# Patient Record
Sex: Male | Born: 1995 | Race: White | Hispanic: No | Marital: Married | Smoking: Never smoker
Health system: Southern US, Community
[De-identification: ages and names within clinical notes are randomized; demographics above are authoritative.]

## PROBLEM LIST (undated history)

## (undated) DIAGNOSIS — R519 Headache, unspecified: Secondary | ICD-10-CM

## (undated) DIAGNOSIS — G43909 Migraine, unspecified, not intractable, without status migrainosus: Secondary | ICD-10-CM

## (undated) DIAGNOSIS — F32A Depression, unspecified: Secondary | ICD-10-CM

## (undated) HISTORY — DX: Depression, unspecified: F32.A

## (undated) HISTORY — DX: Headache, unspecified: R51.9

---
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
## 2016-06-14 ENCOUNTER — Emergency Department (HOSPITAL_BASED_OUTPATIENT_CLINIC_OR_DEPARTMENT_OTHER)
Admission: EM | Admit: 2016-06-14 | Discharge: 2016-06-14 | Disposition: A | Discharge status: Home / Self Care | Payer: Managed Care, Other (non HMO) | Attending: Emergency Medicine | Admitting: Emergency Medicine

## 2016-06-14 ENCOUNTER — Emergency Department (HOSPITAL_BASED_OUTPATIENT_CLINIC_OR_DEPARTMENT_OTHER): Payer: Managed Care, Other (non HMO)

## 2016-06-14 ENCOUNTER — Encounter (HOSPITAL_BASED_OUTPATIENT_CLINIC_OR_DEPARTMENT_OTHER): Payer: Self-pay | Admitting: *Deleted

## 2016-06-14 DIAGNOSIS — N200 Calculus of kidney: Secondary | ICD-10-CM

## 2016-06-14 DIAGNOSIS — R229 Localized swelling, mass and lump, unspecified: Secondary | ICD-10-CM

## 2016-06-14 DIAGNOSIS — IMO0002 Reserved for concepts with insufficient information to code with codable children: Secondary | ICD-10-CM

## 2016-06-14 DIAGNOSIS — Z79899 Other long term (current) drug therapy: Secondary | ICD-10-CM | POA: Insufficient documentation

## 2016-06-14 DIAGNOSIS — R109 Unspecified abdominal pain: Secondary | ICD-10-CM | POA: Diagnosis present

## 2016-06-14 HISTORY — DX: Migraine, unspecified, not intractable, without status migrainosus: G43.909

## 2016-06-14 LAB — CBC WITH DIFFERENTIAL/PLATELET
BASOS PCT: 0 %
Basophils Absolute: 0 10*3/uL (ref 0.0–0.1)
EOS PCT: 0 %
Eosinophils Absolute: 0 10*3/uL (ref 0.0–0.7)
HCT: 47.1 % (ref 39.0–52.0)
HEMOGLOBIN: 16.3 g/dL (ref 13.0–17.0)
LYMPHS ABS: 1.3 10*3/uL (ref 0.7–4.0)
Lymphocytes Relative: 10 %
MCH: 28.5 pg (ref 26.0–34.0)
MCHC: 34.6 g/dL (ref 30.0–36.0)
MCV: 82.5 fL (ref 78.0–100.0)
MONO ABS: 0.9 10*3/uL (ref 0.1–1.0)
Monocytes Relative: 7 %
Neutro Abs: 10.6 10*3/uL — ABNORMAL HIGH (ref 1.7–7.7)
Neutrophils Relative %: 83 %
PLATELETS: 301 10*3/uL (ref 150–400)
RBC: 5.71 MIL/uL (ref 4.22–5.81)
RDW: 13.6 % (ref 11.5–15.5)
WBC: 12.8 10*3/uL — ABNORMAL HIGH (ref 4.0–10.5)

## 2016-06-14 LAB — COMPREHENSIVE METABOLIC PANEL
ALK PHOS: 72 U/L (ref 38–126)
ALT: 32 U/L (ref 17–63)
ANION GAP: 9 (ref 5–15)
AST: 24 U/L (ref 15–41)
Albumin: 4.4 g/dL (ref 3.5–5.0)
BUN: 20 mg/dL (ref 6–20)
CALCIUM: 9.4 mg/dL (ref 8.9–10.3)
CO2: 28 mmol/L (ref 22–32)
Chloride: 101 mmol/L (ref 101–111)
Creatinine, Ser: 0.93 mg/dL (ref 0.61–1.24)
GFR calc non Af Amer: >60 mL/min (ref >60)
Glucose, Bld: 127 mg/dL — ABNORMAL HIGH (ref 65–99)
Potassium: 3.9 mmol/L (ref 3.5–5.1)
SODIUM: 138 mmol/L (ref 135–145)
TOTAL PROTEIN: 7.5 g/dL (ref 6.5–8.1)
Total Bilirubin: 0.5 mg/dL (ref 0.3–1.2)

## 2016-06-14 LAB — URINALYSIS, MICROSCOPIC (REFLEX)

## 2016-06-14 LAB — URINALYSIS, ROUTINE W REFLEX MICROSCOPIC
Bilirubin Urine: NEGATIVE
Glucose, UA: NEGATIVE mg/dL
Ketones, ur: NEGATIVE mg/dL
LEUKOCYTES UA: NEGATIVE
Nitrite: NEGATIVE
PROTEIN: 30 mg/dL — AB
Specific Gravity, Urine: 1.026 (ref 1.005–1.030)
pH: 8.5 — ABNORMAL HIGH (ref 5.0–8.0)

## 2016-06-14 LAB — LIPASE, BLOOD: LIPASE: 25 U/L (ref 11–51)

## 2016-06-14 MED ORDER — SODIUM CHLORIDE 0.9 % IV BOLUS (SEPSIS)
1000.0000 mL | Freq: Once | INTRAVENOUS | Status: AC
Start: 2016-06-14 — End: 2016-06-14
  Administered 2016-06-14: 1000 mL via INTRAVENOUS

## 2016-06-14 MED ORDER — HYDROMORPHONE HCL 1 MG/ML IJ SOLN
1.0000 mg | Freq: Once | INTRAMUSCULAR | Status: AC
  Administered 2016-06-14: 1 mg via INTRAVENOUS
  Filled 2016-06-14: qty 1

## 2016-06-14 MED ORDER — FENTANYL CITRATE (PF) 100 MCG/2ML IJ SOLN
50.0000 ug | INTRAMUSCULAR | Status: DC | PRN
  Administered 2016-06-14: 50 ug via INTRAVENOUS
  Filled 2016-06-14: qty 2

## 2016-06-14 MED ORDER — KETOROLAC TROMETHAMINE 30 MG/ML IJ SOLN
30.0000 mg | Freq: Once | INTRAMUSCULAR | Status: AC
  Administered 2016-06-14: 30 mg via INTRAVENOUS
  Filled 2016-06-14: qty 1

## 2016-06-14 MED ORDER — SODIUM CHLORIDE 0.9 % IV BOLUS (SEPSIS)
1000.0000 mL | Freq: Once | INTRAVENOUS | Status: AC
  Administered 2016-06-14: 1000 mL via INTRAVENOUS

## 2016-06-14 MED ORDER — TAMSULOSIN HCL 0.4 MG PO CAPS: 0.4000 mg | ORAL_CAPSULE | Freq: Every day | ORAL | 0 refills | Status: DC

## 2016-06-14 MED ORDER — ONDANSETRON HCL 4 MG/2ML IJ SOLN
4.0000 mg | Freq: Once | INTRAMUSCULAR | Status: AC
  Administered 2016-06-14: 4 mg via INTRAVENOUS
  Filled 2016-06-14: qty 2

## 2016-06-14 MED ORDER — MORPHINE SULFATE (PF) 4 MG/ML IV SOLN
4.0000 mg | Freq: Once | INTRAVENOUS | Status: AC
  Administered 2016-06-14: 4 mg via INTRAVENOUS
  Filled 2016-06-14: qty 1

## 2016-06-14 MED ORDER — MORPHINE SULFATE 15 MG PO TABS: 15.0000 mg | ORAL_TABLET | ORAL | 0 refills | Status: DC | PRN

## 2016-06-14 MED ORDER — ONDANSETRON 4 MG PO TBDP: ORAL_TABLET | ORAL | 0 refills | Status: DC

## 2016-06-14 MED ORDER — TAMSULOSIN HCL 0.4 MG PO CAPS
0.4000 mg | ORAL_CAPSULE | ORAL | Status: AC
  Administered 2016-06-14: 0.4 mg via ORAL
  Filled 2016-06-14: qty 1

## 2016-06-14 MED ORDER — MORPHINE SULFATE (PF) 4 MG/ML IV SOLN
4.0000 mg | Freq: Once | INTRAVENOUS | Status: DC
Start: 2016-06-14 — End: 2016-06-14

## 2016-06-14 NOTE — ED Notes (Signed)
Pt transported to CT and US. 

## 2016-06-14 NOTE — Discharge Instructions (Signed)
Take 2 over-the-counter naproxen tablets twice a day for pain. Also take tylenol 1000mg(2 extra strength) four times a day.   Then take the pain medicine if you feel like you need it. Narcotics do not help with the pain, they only make you care about it less.  You can become addicted to this, people may break into your house to steal it.  It will constipate you.  If you drive under the influence of this medicine you can get a DUI.    

## 2016-06-14 NOTE — ED Notes (Signed)
ED Provider at bedside. 

## 2016-06-14 NOTE — ED Triage Notes (Signed)
Pt c/o sudden right flank pain which radiates to right lower abd and groin x 40 mins

## 2016-06-14 NOTE — ED Notes (Signed)
Pt verbalizes understanding of d/c instructions and denies any further needs at this time. 

## 2016-06-14 NOTE — ED Provider Notes (Signed)
[facility] Provider Note   CSN: [id number] Arrival date & time: 06/14/16  2032  By signing my name below, I, Linna Darner, attest that this documentation has been prepared under the direction and in the presence of physician practitioner, Melene Plan, DO. Electronically Signed: Linna Darner, Scribe. 06/14/2016. 9:31 PM.  History   Chief Complaint Chief Complaint  Patient presents with  . Flank Pain    The history is provided by the patient. No language interpreter was used.  Flank Pain  This is a new problem. The current episode started 1 to 2 hours ago. The problem has not changed since onset.Pertinent negatives include no chest pain, no abdominal pain, no headaches and no shortness of breath. Nothing aggravates the symptoms. Nothing relieves the symptoms.   HPI Comments: Mario Shaffer is a 21 y.o. male who presents to the Emergency Department complaining of sudden onset, constant, waxing and waning, severe, right flank pain beginning about 2 hours ago. He reports pain radiation into his pelvis, right groin, and right testicle. He states his most severe pain currently is in his right testicle. No modifying factors noted. No recent trauma or injury to his testicles. Pt has had several episodes of nausea/vomiting since onset and states these symptoms are improved since receiving Zofran in triage tonight. No h/o similar symptoms in the past. No h/o kidney stones. He denies hematuria, fever, chills, or any other associated symptoms.  Past Medical History:  Diagnosis Date  . Migraine     There are no active problems to display for this patient.   History reviewed. No pertinent surgical history.     Home Medications    Prior to Admission medications   Medication Sig Start Date End Date Taking? Authorizing Provider  traMADol (ULTRAM) 50 MG tablet Take by mouth every 6 (six) hours as needed.   Yes Historical Provider, MD  morphine (MSIR) 15 MG tablet Take 1 tablet (15  mg total) by mouth every 4 (four) hours as needed for severe pain. 06/14/16   Melene Plan, DO  ondansetron (ZOFRAN ODT) 4 MG disintegrating tablet 4mg  ODT q4 hours prn nausea/vomit 06/14/16   Melene Plan, DO  tamsulosin (FLOMAX) 0.4 MG CAPS capsule Take 1 capsule (0.4 mg total) by mouth daily after supper. 06/14/16   Melene Plan, DO    Family History History reviewed. No pertinent family history.  Social History Social History  Substance Use Topics  . Smoking status: Never Smoker  . Smokeless tobacco: Not on file  . Alcohol use No     Allergies   Patient has no known allergies.   Review of Systems Review of Systems  Constitutional: Negative for chills and fever.  Respiratory: Negative for shortness of breath.   Cardiovascular: Negative for chest pain.  Gastrointestinal: Negative for abdominal pain.  Genitourinary: Positive for flank pain (right) and testicular pain (right). Negative for hematuria.  Neurological: Negative for headaches.  All other systems reviewed and are negative.    Physical Exam Updated Vital Signs BP 126/92 (BP Location: Left Arm)   Pulse (!) 53   Temp 97.7 F (36.5 C)   Resp 18   Ht 6\' 3"  (1.905 m)   Wt 230 lb (104.3 kg)   SpO2 99%   BMI 28.75 kg/m   Physical Exam  Constitutional: He is oriented to person, place, and time. He appears well-developed and well-nourished.  Appears uncomfortable.  HENT:  Head: Normocephalic and atraumatic.  Eyes: EOM are normal. Pupils are equal, round, and reactive  to light.  Neck: Normal range of motion. Neck supple. No JVD present.  Cardiovascular: Normal rate and regular rhythm.  Exam reveals no gallop and no friction rub.   No murmur heard. Pulmonary/Chest: No respiratory distress. He has no wheezes.  Abdominal: He exhibits no distension. There is tenderness. There is no rebound and no guarding.  Pain in RLQ. No rebound.  Genitourinary:  Genitourinary Comments: Tenderness about posterior pole of right testicle. No  masses, no erythema.    Musculoskeletal: Normal range of motion.  Neurological: He is alert and oriented to person, place, and time.  Skin: No rash noted. There is pallor.  Pale.  Psychiatric: He has a normal mood and affect. His behavior is normal.  Nursing note and vitals reviewed.    ED Treatments / Results  Labs (all labs ordered are listed, but only abnormal results are displayed) Labs Reviewed  URINALYSIS, ROUTINE W REFLEX MICROSCOPIC - Abnormal; Notable for the following:       Result Value   APPearance TURBID (*)    pH 8.5 (*)    Hgb urine dipstick LARGE (*)    Protein, ur 30 (*)    All other components within normal limits  URINALYSIS, MICROSCOPIC (REFLEX) - Abnormal; Notable for the following:    Bacteria, UA RARE (*)    Squamous Epithelial / LPF 0-5 (*)    All other components within normal limits  CBC WITH DIFFERENTIAL/PLATELET - Abnormal; Notable for the following:    WBC 12.8 (*)    Neutro Abs 10.6 (*)    All other components within normal limits  COMPREHENSIVE METABOLIC PANEL - Abnormal; Notable for the following:    Glucose, Bld 127 (*)    All other components within normal limits  LIPASE, BLOOD    EKG  EKG Interpretation None       Radiology [place] Scrotum  Result Date: 06/14/2016 CLINICAL DATA:  Sudden onset of right flank pain radiating to the testicle EXAM: SCROTAL ULTRASOUND DOPPLER ULTRASOUND OF THE TESTICLES TECHNIQUE: Complete ultrasound examination of the testicles, epididymis, and other scrotal structures was performed. Color and spectral Doppler ultrasound were also utilized to evaluate blood flow to the testicles. COMPARISON:  CT 06/14/2016 FINDINGS: Right testicle Measurements: 4.6 x 2 x 3.1 cm. No mass or microlithiasis visualized. Left testicle Measurements: 5 x 2.2 x 3.4 cm. No mass or microlithiasis visualized. Right epididymis:  Small cyst measuring 2 mm. Left epididymis:  Small cyst measuring 2 mm. Hydrocele:  None visualized. Varicocele:   None visualized. Pulsed Doppler interrogation of both testes demonstrates normal low resistance arterial and venous waveforms bilaterally. IMPRESSION: 1. No sonographic evidence for testicular torsion or mass 2. Tiny epididymal cysts Electronically Signed   By: Jasmine PangKim  Fujinaga M.D.   On: 06/14/2016 22:37   [place] Art/ven Flow Abd Pelv Doppler  Result Date: 06/14/2016 CLINICAL DATA:  Sudden onset of right flank pain radiating to the testicle EXAM: SCROTAL ULTRASOUND DOPPLER ULTRASOUND OF THE TESTICLES TECHNIQUE: Complete ultrasound examination of the testicles, epididymis, and other scrotal structures was performed. Color and spectral Doppler ultrasound were also utilized to evaluate blood flow to the testicles. COMPARISON:  CT 06/14/2016 FINDINGS: Right testicle Measurements: 4.6 x 2 x 3.1 cm. No mass or microlithiasis visualized. Left testicle Measurements: 5 x 2.2 x 3.4 cm. No mass or microlithiasis visualized. Right epididymis:  Small cyst measuring 2 mm. Left epididymis:  Small cyst measuring 2 mm. Hydrocele:  None visualized. Varicocele:  None visualized. Pulsed Doppler interrogation of both testes  demonstrates normal low resistance arterial and venous waveforms bilaterally. IMPRESSION: 1. No sonographic evidence for testicular torsion or mass 2. Tiny epididymal cysts Electronically Signed   By: Jasmine Pang M.D.   On: 06/14/2016 22:37   Ct Renal Stone Study  Result Date: 06/14/2016 CLINICAL DATA:  Sudden onset right flank pain for 2 hours. Pain radiates down the right pelvis, right groin, and right testicle. EXAM: CT ABDOMEN AND PELVIS WITHOUT CONTRAST TECHNIQUE: Multidetector CT imaging of the abdomen and pelvis was performed following the standard protocol without IV contrast. COMPARISON:  None. FINDINGS: Lower chest: Mild dependent changes in the lung bases. Hepatobiliary: No focal liver abnormality is seen. No gallstones, gallbladder wall thickening, or biliary dilatation. Pancreas: Unremarkable. No  pancreatic ductal dilatation or surrounding inflammatory changes. Spleen: Normal in size without focal abnormality. Adrenals/Urinary Tract: No adrenal gland nodules. Mild right hydronephrosis and hydroureter. Tiny stone demonstrated in the distal right ureter at the ureterovesical junction. Stone appears poorly mineralized and measures about 3 mm in diameter. There is stranding around the right kidney and ureter. No additional stones identified. Left kidney and collecting system are decompressed. Bladder is decompressed. Stomach/Bowel: Stomach is within normal limits. Appendix appears normal. No evidence of bowel wall thickening, distention, or inflammatory changes. Vascular/Lymphatic: No significant vascular findings are present. No enlarged abdominal or pelvic lymph nodes. Reproductive: Prostate gland is not enlarged. Other: Small amount of free fluid in the pelvis is likely reactive. No free air in the abdomen. Abdominal wall musculature appears intact. Musculoskeletal: Schmorl's node at L4.  No destructive bone lesions. IMPRESSION: 3 mm stone in the distal right ureter with moderate proximal obstruction. Electronically Signed   By: Burman Nieves M.D.   On: 06/14/2016 22:03    Procedures Procedures (including critical care time)  DIAGNOSTIC STUDIES: Oxygen Saturation is 100% on RA, normal by my interpretation.    COORDINATION OF CARE: 9:38 PM Discussed treatment plan with pt at bedside and pt agreed to plan.  Medications Ordered in ED Medications  fentaNYL (SUBLIMAZE) injection 50 mcg (50 mcg Intravenous Given 06/14/16 2110)  ondansetron (ZOFRAN) injection 4 mg (4 mg Intravenous Given 06/14/16 2109)  morphine 4 MG/ML injection 4 mg (4 mg Intravenous Given 06/14/16 2140)  ondansetron (ZOFRAN) injection 4 mg (4 mg Intravenous Given 06/14/16 2302)  sodium chloride 0.9 % bolus 1,000 mL (0 mLs Intravenous Stopped 06/14/16 2143)  ketorolac (TORADOL) 30 MG/ML injection 30 mg (30 mg Intravenous Given 06/14/16  2224)  sodium chloride 0.9 % bolus 1,000 mL (1,000 mLs Intravenous New Bag/Given 06/14/16 2302)  HYDROmorphone (DILAUDID) injection 1 mg (1 mg Intravenous Given 06/14/16 2304)     Initial Impression / Assessment and Plan / ED Course  I have reviewed the triage vital signs and the nursing notes.  Pertinent labs & imaging results that were available during my care of the patient were reviewed by me and considered in my medical decision making (see chart for details).  Clinical Course     21 yo M With a chief complaint of right flank pain. Started suddenly about an hour and a half ago. He feels that the pain is migrated down to his right lower quadrant analysis testicle. On my exam patient was some mild testicular pain worst in the right lower quadrant. CT scan study with a 3 mm partially obstructing stone. Patient is afebrile UAs without infection. Ultrasound of the testicle without torsion. Will control the patient's pain. Give IV fluids.  Patient feeling better on reassessment. Discharge home with  urology follow-up.  11:07 PM:  I have discussed the diagnosis/risks/treatment options with the patient and family and believe the pt to be eligible for discharge home to follow-up with PCP. We also discussed returning to the ED immediately if new or worsening sx occur. We discussed the sx which are most concerning (e.g., sudden worsening pain, fever, inability to tolerate by mouth) that necessitate immediate return. Medications administered to the patient during their visit and any new prescriptions provided to the patient are listed below.  Medications given during this visit Medications  fentaNYL (SUBLIMAZE) injection 50 mcg (50 mcg Intravenous Given 06/14/16 2110)  ondansetron (ZOFRAN) injection 4 mg (4 mg Intravenous Given 06/14/16 2109)  morphine 4 MG/ML injection 4 mg (4 mg Intravenous Given 06/14/16 2140)  ondansetron (ZOFRAN) injection 4 mg (4 mg Intravenous Given 06/14/16 2302)  sodium chloride 0.9 %  bolus 1,000 mL (0 mLs Intravenous Stopped 06/14/16 2143)  ketorolac (TORADOL) 30 MG/ML injection 30 mg (30 mg Intravenous Given 06/14/16 2224)  sodium chloride 0.9 % bolus 1,000 mL (1,000 mLs Intravenous New Bag/Given 06/14/16 2302)  HYDROmorphone (DILAUDID) injection 1 mg (1 mg Intravenous Given 06/14/16 2304)     The patient appears reasonably screen and/or stabilized for discharge and I doubt any other medical condition or other [facility] requiring further screening, evaluation, or treatment in the ED at this time prior to discharge.    Final Clinical Impressions(s) / ED Diagnoses   Final diagnoses:  Mass  Kidney stone    New Prescriptions New Prescriptions   MORPHINE (MSIR) 15 MG TABLET    Take 1 tablet (15 mg total) by mouth every 4 (four) hours as needed for severe pain.   ONDANSETRON (ZOFRAN ODT) 4 MG DISINTEGRATING TABLET    4mg  ODT q4 hours prn nausea/vomit   TAMSULOSIN (FLOMAX) 0.4 MG CAPS CAPSULE    Take 1 capsule (0.4 mg total) by mouth daily after supper.   I personally performed the services described in this documentation, which was scribed in my presence. The recorded information has been reviewed and is accurate.     Melene Plan, DO 06/14/16 2307

## 2017-09-13 IMAGING — CT CT RENAL STONE PROTOCOL
2 of 4 series · 16 of 46 positions shown, 18 images · non-contrast
Comparison: None.

CLINICAL DATA: Sudden onset right flank pain for 2 hours. Pain
radiates down the right pelvis, right groin, and right testicle.

EXAM:
CT ABDOMEN AND PELVIS WITHOUT CONTRAST
TECHNIQUE: Multidetector CT imaging of the abdomen and pelvis was performed
following the standard protocol without IV contrast.

[Series 2: axial st · axial · 0.96mm/px · z∈[-707,-172]mm · 13 of 117 slices shown, 15 images]
[im 5/117  soft-tissue]
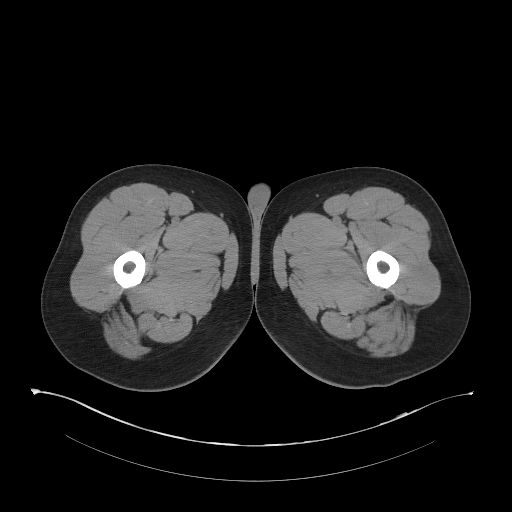
[im 5/117  bone]
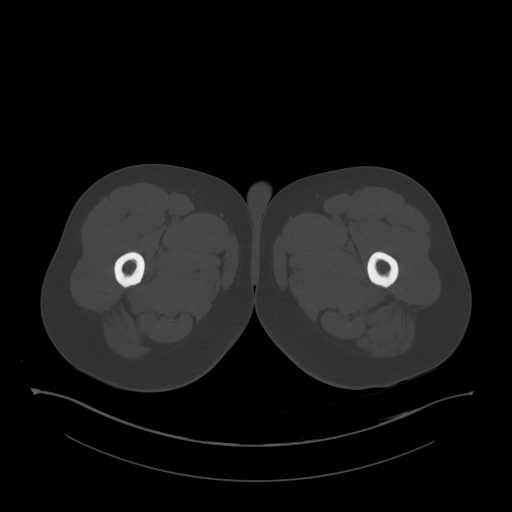
[im 14/117  soft-tissue]
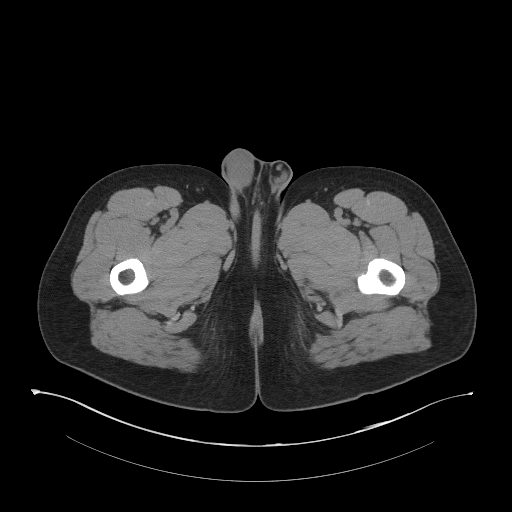
[im 23/117  soft-tissue]
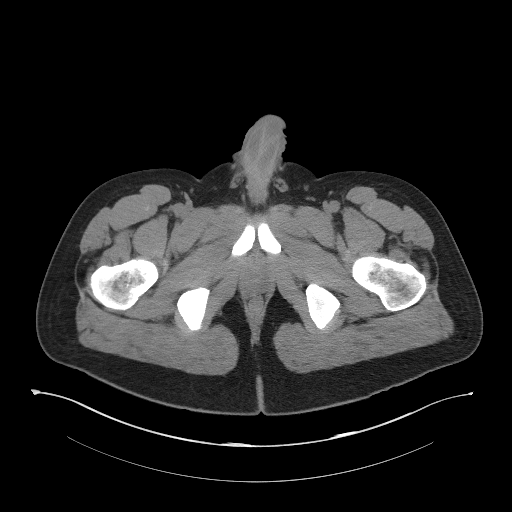
[im 32/117  soft-tissue]
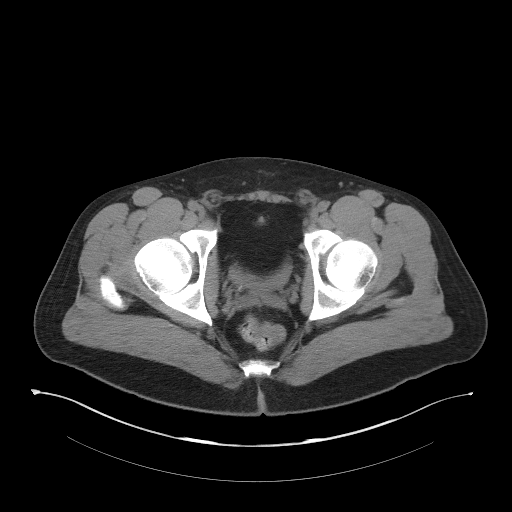
[im 41/117  soft-tissue]
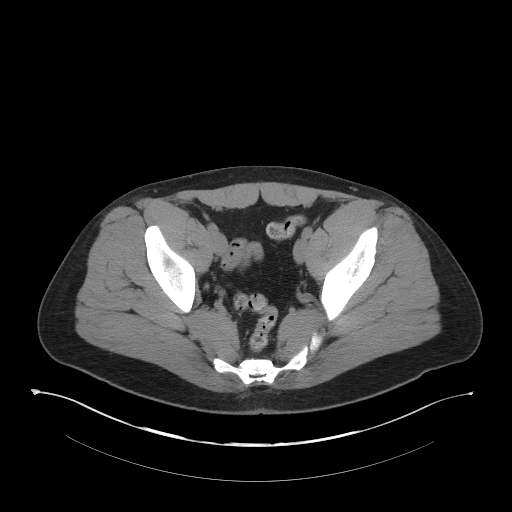
[im 50/117  soft-tissue]
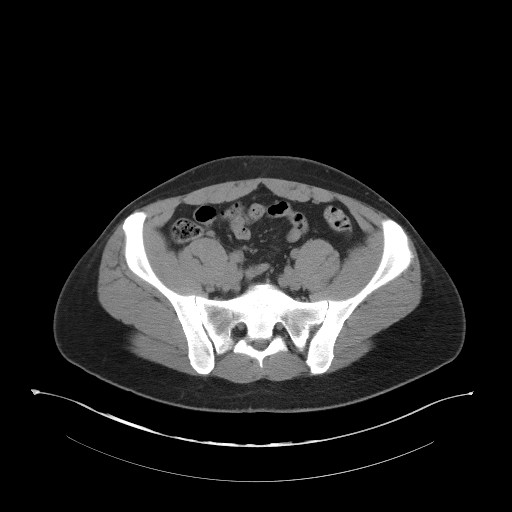
[im 59/117  soft-tissue]
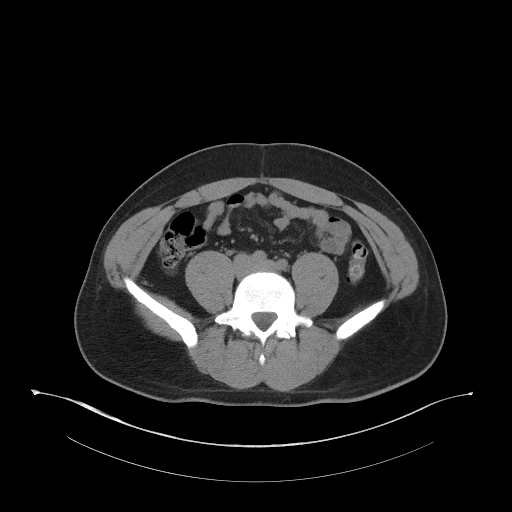
[im 67/117  soft-tissue]
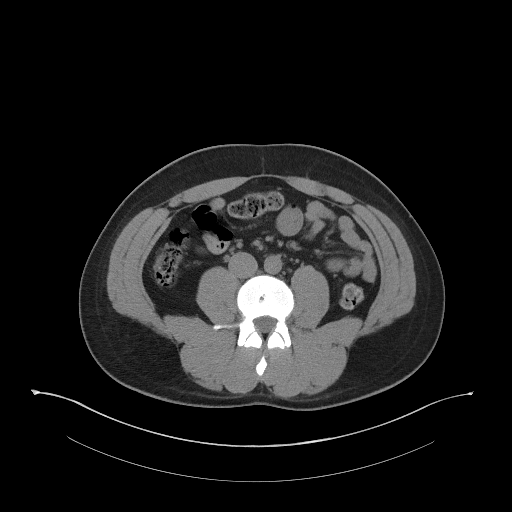
[im 76/117  soft-tissue]
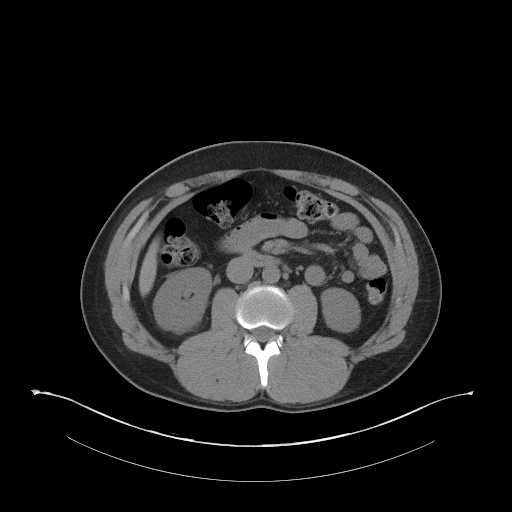
[im 76/117  bone]
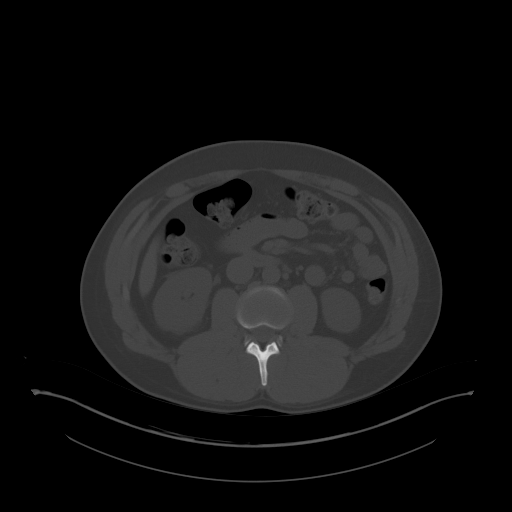
[im 85/117  soft-tissue]
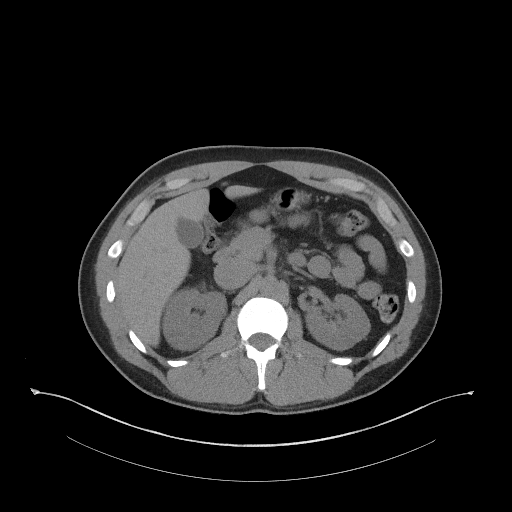
[im 94/117  soft-tissue]
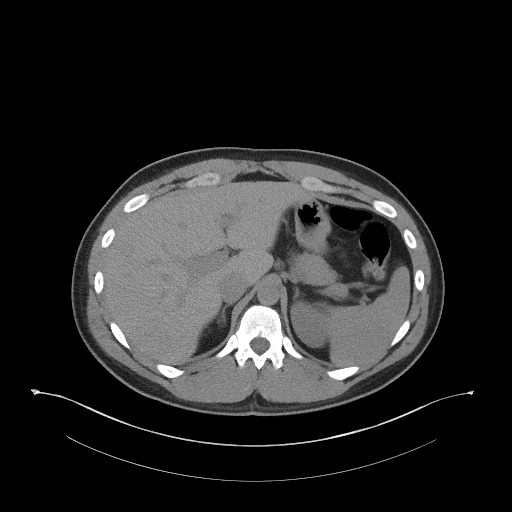
[im 103/117  soft-tissue]
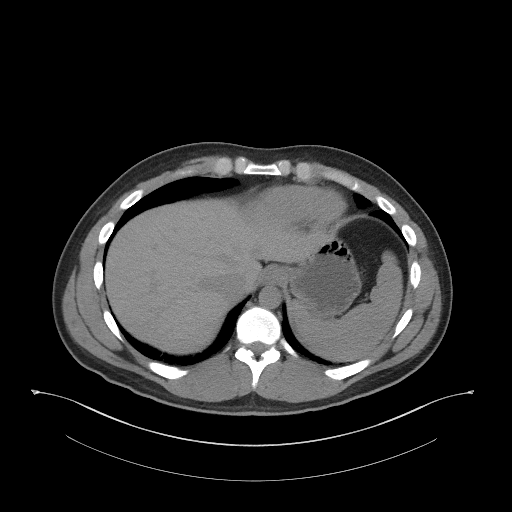
[im 112/117  soft-tissue]
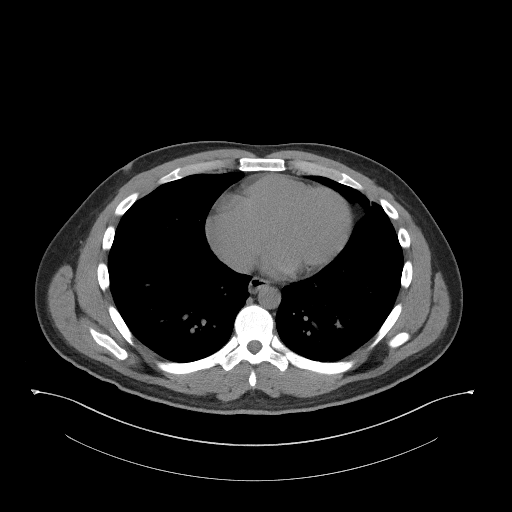

[Series 5: coronal st · coronal · 0.93mm/px · 3 of 90 slices shown]
[im 30/90  soft-tissue]
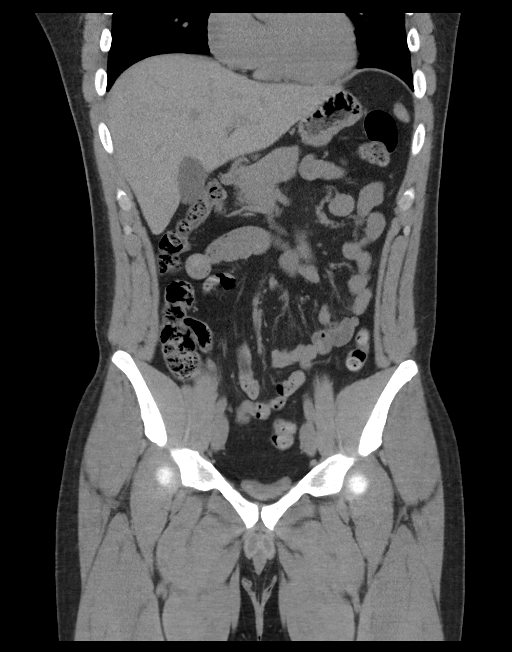
[im 40/90  soft-tissue]
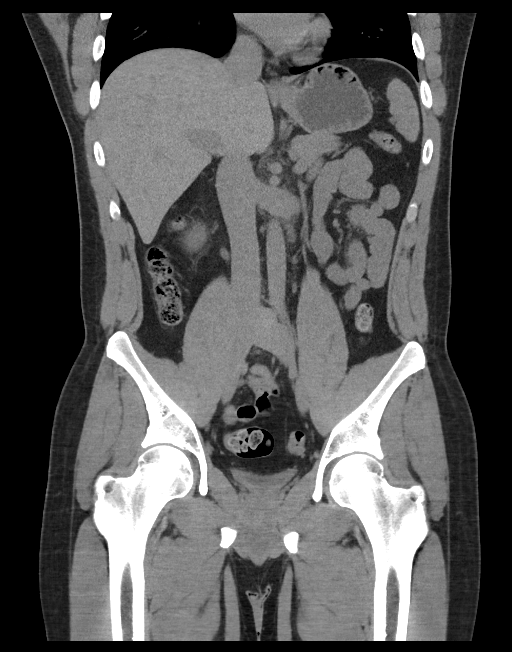
[im 50/90  soft-tissue]
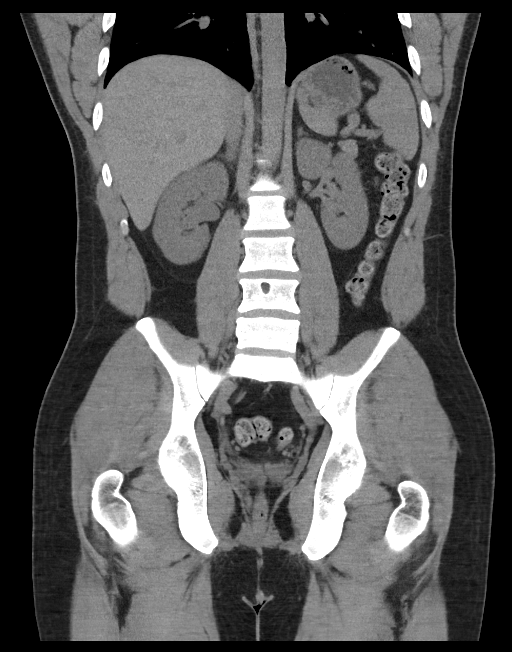

[16 of 46 positions shown; findings below may reference images not displayed]

FINDINGS: Lower chest: Mild dependent changes in the lung bases.

Hepatobiliary: No focal liver abnormality is seen. No gallstones,
gallbladder wall thickening, or biliary dilatation.

Pancreas: Unremarkable. No pancreatic ductal dilatation or
surrounding inflammatory changes.

Spleen: Normal in size without focal abnormality.

Adrenals/Urinary Tract: No adrenal gland nodules. Mild right
hydronephrosis and hydroureter. Tiny stone demonstrated in the
distal right ureter at the ureterovesical junction. Stone appears
poorly mineralized and measures about 3 mm in diameter. There is
stranding around the right kidney and ureter. No additional stones
identified. Left kidney and collecting system are decompressed.
Bladder is decompressed.

Stomach/Bowel: Stomach is within normal limits. Appendix appears
normal. No evidence of bowel wall thickening, distention, or
inflammatory changes.

Vascular/Lymphatic: No significant vascular findings are present. No
enlarged abdominal or pelvic lymph nodes.

Reproductive: Prostate gland is not enlarged.

Other: Small amount of free fluid in the pelvis is likely reactive.
No free air in the abdomen. Abdominal wall musculature appears
intact.

Musculoskeletal: Schmorl's node at L4.  No destructive bone lesions.
IMPRESSION: 3 mm stone in the distal right ureter with moderate proximal
obstruction.

## 2021-04-26 ENCOUNTER — Other Ambulatory Visit: Payer: Self-pay

## 2021-04-26 ENCOUNTER — Ambulatory Visit: Payer: BLUE CROSS/BLUE SHIELD | Admitting: Family Medicine

## 2021-04-26 ENCOUNTER — Encounter: Payer: Self-pay | Admitting: Family Medicine

## 2021-04-26 VITALS — BP 118/78 | HR 73 | Temp 97.9°F | Ht 74.0 in | Wt 248.4 lb

## 2021-04-26 DIAGNOSIS — Z114 Encounter for screening for human immunodeficiency virus [HIV]: Secondary | ICD-10-CM

## 2021-04-26 DIAGNOSIS — Z0000 Encounter for general adult medical examination without abnormal findings: Secondary | ICD-10-CM | POA: Diagnosis not present

## 2021-04-26 DIAGNOSIS — Z1159 Encounter for screening for other viral diseases: Secondary | ICD-10-CM

## 2021-04-26 DIAGNOSIS — R41840 Attention and concentration deficit: Secondary | ICD-10-CM | POA: Diagnosis not present

## 2021-04-26 LAB — COMPREHENSIVE METABOLIC PANEL
ALT: 11 U/L (ref 0–53)
AST: 17 U/L (ref 0–37)
Albumin: 4.5 g/dL (ref 3.5–5.2)
Alkaline Phosphatase: 72 U/L (ref 39–117)
BUN: 11 mg/dL (ref 6–23)
CO2: 32 mEq/L (ref 19–32)
Calcium: 9.6 mg/dL (ref 8.4–10.5)
Chloride: 104 mEq/L (ref 96–112)
Creatinine, Ser: 0.87 mg/dL (ref 0.40–1.50)
GFR: 119.21 mL/min (ref >60.00)
Glucose, Bld: 84 mg/dL (ref 70–99)
Potassium: 4.3 mEq/L (ref 3.5–5.1)
Sodium: 141 mEq/L (ref 135–145)
Total Bilirubin: 0.5 mg/dL (ref 0.2–1.2)
Total Protein: 7.1 g/dL (ref 6.0–8.3)

## 2021-04-26 LAB — CBC
HCT: 44.7 % (ref 39.0–52.0)
Hemoglobin: 15.1 g/dL (ref 13.0–17.0)
MCHC: 33.8 g/dL (ref 30.0–36.0)
MCV: 86 fl (ref 78.0–100.0)
Platelets: 319 10*3/uL (ref 150.0–400.0)
RBC: 5.2 Mil/uL (ref 4.22–5.81)
RDW: 13.2 % (ref 11.5–15.5)
WBC: 6 10*3/uL (ref 4.0–10.5)

## 2021-04-26 LAB — LIPID PANEL
Cholesterol: 176 mg/dL (ref 0–200)
HDL: 34.6 mg/dL — ABNORMAL LOW (ref >39.00)
LDL Cholesterol: 115 mg/dL — ABNORMAL HIGH (ref 0–99)
NonHDL: 141.39
Total CHOL/HDL Ratio: 5
Triglycerides: 133 mg/dL (ref 0.0–149.0)
VLDL: 26.6 mg/dL (ref 0.0–40.0)

## 2021-04-26 MED ORDER — METHYLPHENIDATE HCL ER (OSM) 27 MG PO TBCR: 27.0000 mg | EXTENDED_RELEASE_TABLET | ORAL | 0 refills | Status: DC

## 2021-04-26 NOTE — Patient Instructions (Addendum)
Give [place] 2-3 business days to get the results of your labs back.   Keep the diet clean and stay active.  Do monthly self testicular checks in the shower. You are feeling for lumps/bumps that don't belong. If you feel anything like this, let me know!  If you do not hear anything about your referral in the next 1-2 weeks, call our office and ask for an update.  Foods that may reduce pain: 1) Ginger 2) Blueberries 3) Salmon 4) Pumpkin seeds 5) dark chocolate 6) turmeric 7) tart cherries 8) virgin olive oil 9) chilli peppers 10) mint  Let us know if you need anything.

## 2021-04-26 NOTE — Progress Notes (Signed)
Chief Complaint  Patient presents with   New Patient (Initial Visit)    Well Male Mario Shaffer is here for a complete physical.   His last physical was >1 year ago.  Current diet: in general, a "healthy" diet.   Current exercise: active at work and in yard Weight trend: stable Fatigue out of ordinary? No. Seat belt? Yes.    Health maintenance Tetanus- Yes HIV- No Hep C- No  ADHD Diagnosed when he was 12-13 by his pediatrician. He has been having issues w focus lately. He took Daytrana patches when he was a senior in [employer] and in college that worked well but burned his skin. It has been around 4-5 years since he took it. He has a hx of 6-7 concussions in HS.   Past Medical History:  Diagnosis Date   Depression    Headache    Migraine      History reviewed. No pertinent surgical history.  Medications  Takes no meds routinely.    Allergies No Known Allergies  Family History Family History  Problem Relation Age of Onset   Hyperlipidemia Mother    Cancer Mother    Diabetes Maternal Grandmother    Cancer Maternal Grandfather    Diabetes Maternal Grandfather    Cancer Paternal Grandmother    Hearing loss Paternal Grandfather     Review of Systems: Constitutional: no fevers or chills Eye:  no recent significant change in vision Ear/Nose/Mouth/Throat:  Ears:  no hearing loss Nose/Mouth/Throat:  no complaints of nasal congestion, no sore throat Cardiovascular:  no chest pain Respiratory:  no shortness of breath Gastrointestinal:  no abdominal pain, no change in bowel habits GU:  Male: negative for dysuria Musculoskeletal/Extremities:  no pain of the joints Integumentary (Skin/Breast):  no abnormal skin lesions reported Neurologic:  no headaches Endocrine: No unexpected weight changes Hematologic/Lymphatic:  no night sweats  Exam BP 118/78   Pulse 73   Temp 97.9 F (36.6 C) (Oral)   Ht 6\' 2"  (1.88 m)   Wt 248 lb 6 oz (112.7 kg)   SpO2 99%   BMI 31.89 kg/m   General:  well developed, well nourished, in no apparent distress Skin:  no significant moles, warts, or growths Head:  no masses, lesions, or tenderness Eyes:  pupils equal and round, sclera anicteric without injection Ears:  canals without lesions, TMs shiny without retraction, no obvious effusion, no erythema Nose:  nares patent, septum midline, mucosa normal Throat/Pharynx:  lips and gingiva without lesion; tongue and uvula midline; non-inflamed pharynx; no exudates or postnasal drainage Neck: neck supple without adenopathy, thyromegaly, or masses Lungs:  clear to auscultation, breath sounds equal bilaterally, no respiratory distress Cardio:  regular rate and rhythm, no bruits, no LE edema Abdomen:  abdomen soft, nontender; bowel sounds normal; no masses or organomegaly Genital (male): Deferred Rectal: Deferred Musculoskeletal:  symmetrical muscle groups noted without atrophy or deformity Extremities:  no clubbing, cyanosis, or edema, no deformities, no skin discoloration Neuro:  gait normal; deep tendon reflexes normal and symmetric Psych: well oriented with normal range of affect and appropriate judgment/insight  Assessment and Plan  Well adult exam - Plan: CBC, Comprehensive metabolic panel, Lipid panel  Screening for HIV without presence of risk factors - Plan: HIV Antibody (routine testing w rflx)  Encounter for hepatitis C screening test for low risk patient - Plan: Hepatitis C antibody  Inattention - Plan: Ambulatory referral to Psychology, methylphenidate (CONCERTA) 27 MG PO CR tablet   Well 25 y.o. male.  Counseled on diet and exercise. Self testicular exams recommended at least monthly.  Other orders as above. Declines flu shot, covid vaccine.  Declines 2nd HPV shot for now. In monogamous relationship. ADHD/inattention: Chronic, uncontrolled. Will have formal eval as he has had many things since his last dx and also never had formal eval.  Follow up in 1 mo pending  the above workup. The patient voiced understanding and agreement to the plan.  Jilda Roche [place], DO 04/26/21 9:39 AM

## 2021-04-27 LAB — HEPATITIS C ANTIBODY
Hepatitis C Ab: NONREACTIVE
SIGNAL TO CUT-OFF: 0.07 (ref <1.00)

## 2021-04-27 LAB — HIV ANTIBODY (ROUTINE TESTING W REFLEX): HIV 1&2 Ab, 4th Generation: NONREACTIVE

## 2021-05-26 ENCOUNTER — Encounter: Payer: Self-pay | Admitting: Family Medicine

## 2021-05-26 ENCOUNTER — Ambulatory Visit: Payer: BLUE CROSS/BLUE SHIELD | Admitting: Family Medicine

## 2021-05-26 VITALS — BP 108/70 | HR 77 | Temp 98.1°F | Ht 74.0 in | Wt 249.5 lb

## 2021-05-26 DIAGNOSIS — R41840 Attention and concentration deficit: Secondary | ICD-10-CM | POA: Diagnosis not present

## 2021-05-26 MED ORDER — METHYLPHENIDATE HCL ER (OSM) 36 MG PO TBCR: 36.0000 mg | EXTENDED_RELEASE_TABLET | Freq: Every day | ORAL | 0 refills | Status: DC

## 2021-05-26 NOTE — Patient Instructions (Signed)
Try to contact them once more.  Let us know if you need anything.

## 2021-05-26 NOTE — Progress Notes (Signed)
Chief Complaint  Patient presents with   Follow-up    Mario Shaffer is 25 y.o. male here for inattention follow up.  Patient is currently on Concerta 27 mg/d and compliance is excellent. Symptoms include waning attention.  Side effects include decreased appetite. Patient believes their dose should be increased. Denies tics, difficulties with sleep, self-medication, alcohol/drug abuse, chest pain, or palpitations.  Past Medical History:  Diagnosis Date   Depression    Headache    Migraine     BP 108/70    Pulse 77    Temp 98.1 F (36.7 C) (Oral)    Ht 6\' 2"  (1.88 m)    Wt 249 lb 8 oz (113.2 kg)    SpO2 98%    BMI 32.03 kg/m  Gen- awake, alert, appearing stated age Heart- RRR Lungs- CTAB, no accessory muscle use Neuro- no facial tics Psych- age appropriate judgment and insight, normal mood and affect  Inattention  Chronic, uncontrolled. Increase Concerta from 27 mg/d to 36 mg/d. Reach out to psychology team again.  F/u in 1 mo. Pt voiced understanding and agreement to the plan.  [place], DO 05/26/21 7:34 AM

## 2021-06-28 ENCOUNTER — Encounter: Payer: Self-pay | Admitting: Family Medicine

## 2021-06-28 ENCOUNTER — Ambulatory Visit: Payer: BLUE CROSS/BLUE SHIELD | Admitting: Family Medicine

## 2021-06-28 VITALS — BP 108/62 | HR 81 | Temp 97.6°F | Ht 74.0 in | Wt 248.0 lb

## 2021-06-28 DIAGNOSIS — R41840 Attention and concentration deficit: Secondary | ICD-10-CM

## 2021-06-28 NOTE — Patient Instructions (Signed)
Let me know when you need a refill.   If you do not hear anything about your referral in the next 1-2 weeks, call our office and ask for an update.  Let us know if you need anything.

## 2021-06-28 NOTE — Progress Notes (Signed)
Chief Complaint  Patient presents with   Follow-up    Mario Shaffer is 26 y.o. male here for inattention follow up.  Patient is currently on Concerta 36 mg/d and compliance is excellent. Symptoms are well controlled at this dosage.  Side effects include: none. Patient believes their dose should be unchanged. Denies tics, weight loss, difficulties with sleep, self-medication, alcohol/drug abuse, chest pain, or palpitations.   Past Medical History:  Diagnosis Date   Depression    Headache    Migraine     BP 108/62    Pulse 81    Temp 97.6 F (36.4 C) (Oral)    Ht 6\' 2"  (1.88 m)    Wt 248 lb (112.5 kg)    SpO2 96%    BMI 31.84 kg/m  Gen- awake, alert, appearing stated age Heart- RRR Lungs- CTAB, no accessory muscle use Neuro- no facial tics Psych- age appropriate judgment and insight, normal mood and affect  Inattention - Plan: Ambulatory referral to Psychology  Chronic, stable. Cont Concerta 36 mg/d. Re-refer to [facility] team for formal evaluation.  F/u in 6 mo for Med ck or prn. Pt voiced understanding and agreement to the plan.  [place], DO 06/28/21 7:23 AM

## 2021-08-10 ENCOUNTER — Ambulatory Visit: Payer: BLUE CROSS/BLUE SHIELD | Admitting: Psychologist

## 2021-09-09 ENCOUNTER — Ambulatory Visit: Payer: BLUE CROSS/BLUE SHIELD | Admitting: Psychologist

## 2021-10-12 ENCOUNTER — Ambulatory Visit: Payer: BLUE CROSS/BLUE SHIELD | Admitting: Psychologist

## 2021-10-28 ENCOUNTER — Ambulatory Visit: Payer: BLUE CROSS/BLUE SHIELD | Admitting: Psychologist

## 2021-12-02 ENCOUNTER — Ambulatory Visit (INDEPENDENT_AMBULATORY_CARE_PROVIDER_SITE_OTHER): Payer: BLUE CROSS/BLUE SHIELD | Admitting: Psychology

## 2021-12-02 DIAGNOSIS — F89 Unspecified disorder of psychological development: Secondary | ICD-10-CM | POA: Diagnosis not present

## 2021-12-02 NOTE — Progress Notes (Addendum)
Date: 12/02/2021 Appointment Start Time: 5:05pm (patient experienced technical issues) Duration: 110 minutes Provider: Helmut Muster, PsyD Type of Session: Initial Appointment for Evaluation  Location of Patient: Home Location of Provider: Provider's Home (private office) Type of Contact: WebEx video visit with audio  Session Content:  Prior to proceeding with today's appointment, two pieces of identifying information were obtained from Mario Shaffer to verify identity. In addition, Mario Shaffer's physical location at the time of this appointment was obtained. In the event of technical difficulties, Kerem shared a phone number he could be reached at. Mario Shaffer and this provider participated in today's telepsychological service. Jameek denied anyone else being present in the room or on the virtual appointment.  The provider's role was explained to Mario Shaffer. The provider reviewed and discussed issues of confidentiality, privacy, and limits therein (e.g., reporting obligations). In addition to verbal informed consent, written informed consent for psychological services was obtained from Mario Shaffer prior to the initial appointment. Written consent included information concerning the practice, financial arrangements, and confidentiality and patients' rights. Since the clinic is not a 24/7 crisis center, mental health emergency resources were shared, and the provider explained e-mail, voicemail, and/or other messaging systems should be utilized only for non-emergency reasons. This provider also explained that information obtained during appointments will be placed in their electronic medical record in a confidential manner. Gid verbally acknowledged understanding of the aforementioned and agreed to use mental health emergency resources discussed if needed. Moreover, Mario Shaffer agreed information may be shared with other [facility] or their referring provider(s) as needed for coordination of care. By signing the new patient  documents, Mario Shaffer provided written consent for coordination of care. Mario Shaffer verbally acknowledged understanding he is ultimately responsible for understanding his insurance benefits as it relates to reimbursement of telepsychological and in-person services. This provider also reviewed confidentiality, as it relates to telepsychological services, as well as the rationale for telepsychological services. This provider further explained that video should not be captured, photos should not be taken, nor should testing stimuli be copied or recorded as it would be a copyright violation. Bradford expressed understanding of the aforementioned, and verbally consented to proceed.  Mario Shaffer completed the psychiatric diagnostic evaluation (history, including past, family, social, and  psychiatric history; behavioral observations; and establishment of a provisional diagnosis). The evaluation was completed in 110 minutes. Code 79892 was billed.   Mental Status Examination:  Appearance:  neat Behavior: appropriate to circumstances Mood: neutral Affect: mood congruent Speech: WNL Eye Contact: appropriate Psychomotor Activity: restless Thought Process: linear, logical, and goal directed Content/Perceptual Disturbances: none Orientation: AAOx4 Cognition/Sensorium: intact Insight: good Judgment: good  Provisional DSM-5 diagnosis(es):  F89 Unspecified Disorder of Psychological Development   Plan: Mario Shaffer is currently scheduled for an appointment on 12/09/2021 at 4pm via WebEx video visit with audio.       CONFIDENTIAL PSYCHOLOGICAL EVALUATION ______________________________________________________________________________  Name: Mario Shaffer   Date of Birth: 1995-12-11    Age: 26 Dates of Evaluation: 12/02/2021, 12/06/2021,   SOURCE AND REASON FOR REFERRAL: Mario Shaffer was referred by Dr. Arva Chafe for an evaluation to ascertain if he meets criteria for Attention Deficit/Hyperactivity Disorder  (ADHD).    BACKGROUND INFORMATION AND PRESENTING PROBLEM: Mario Shaffer is a 26 year old male who resides in [place], [place]  Mr. Fife discussed he was diagnosed with ADHD and prescribed Adderall by his pediatrician when "pretty young," noting use of Adderall was helpful and improved his ability to concentrate and retain information. He further discussed he utilized Adderall until middle  school but his mother eventually "weaned him off of it" as she worried about "toxins." He noted use of a Daytrana patch in college, which "worked really well" but caused reduced appetite, "short temper," and sleeping issues. He indicated his ADHD-related concerns have become exacerbated recently secondary to childcare responsibilities and his current PCP expressed concerns his last evaluation was "likely not in-depth" and suggested he pursue an in-depth ADHD evaluation prior to ongoing management of ADHD-related concerns. He described his ADHD-related concerns as including task initiation issues that include studying, household chores, and "pretty much everything" as well as "not registering tasks" as he may forget them, studying was often "last minute cramming," and he "jumps hobbies more than anybody he knows;" being easily distracted by visual movements, noises, and "random thoughts" in his head;" difficulty listening to people even when they are speaking directly to him despite intentions to listen, which leads to others commenting on it; hyperfixation in his interests (e.g., video games, movies, TV shows, and his family's wellbeing) which can lead to the detriment of other higher priority tasks (e.g., sleeping an eating), which contributes to relational strains with his wife and her commenting he "focuses on nothing or all of [his] attention on one thing;" organization issues that lead to "areas quickly becoming cluttered" and an extended time to later clean as he does not "put things in their  required spot;" often missing small details or making careless mistakes (e.g., misplacing items and missing warning signs of a problem); problems planning that lead to him forgetting appointments, not following through on plans, and trouble keeping commitments" without his wife's assistance; habitually fidgeting (e.g., playing with items near him and swiveling in his chair); often feeling he has to be moving or doing something when seated for extended periods (e.g., starting "random tasks [he] sees" and "playing on his phone); a belief he talks too much and others commenting on it, which he attributed to problems prioritizing information he shares with others; interrupting others and his wife comment he "does it all them time" as he worries he will forget what he wants to say or later wishing he had said it; difficulty waiting his turn that has improved since childhood; task maintenance difficulties as he "sees other tasks [he] wants to do" and may forget the original task; driving much faster than others as he "has a lead foot" and can drive over ten miles per hour over the speed limit, noting he has an urged to "beat the time estimates" for the trip which has resulted in one speeding ticket; impulsive purchase decisions that contributed to financial strains in the past, although have improved with his wife's support and various financial responsibilities; not reading instructions for tasks as he "enjoys figuring things out" but it can cause an extended time to be required to complete tasks; feeling irritable or overstimulated as a result of "consecutive loud noises, being confronted or criticized, attention shifting requirements, or multiple people talking to him; and trouble doing things in their proper order or sequence as he has a "bad habit" of trying to skip steps or "do multiple steps at once," which contributes to mistakes and an extended time being required to complete tasks. He also described a ten-month  period of low mood and apathy that lasted approximately ten months. He expressed a belief his ADHD-related concerns are independent of mood and consistent. He stated his coping strategies include attaching importance to tasks, his wife's support, and excelling with tasks that are challenging and continually  changing.  Mr. Turnage denied awareness of experiencing any developmental milestone delays, although noted he was diagnosed with dyslexia and required "extra help" with reading and writing. He described himself as an "average student," and shared how he enjoyed "hands on" classes (e.g., welding, small engine, and plant identification) classes in high school. He discussed he did not finish his accounting degree and "switched from major to major" in college before his father-in-law suggested he would not accept his request to marry his daughter unless he finished a program. 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  Mr. Sweigert described his medical history as significant for knee injury and "several concussions" from football in high school," adding he believes he still experiences migraines from the concussions but his ADHD-related concerns were occurring prior to these head injuries. He also described a history of snoring if sleeping on his back, tossing and turning while sleeping, and not feeling rested upon waking. He reported use of alcohol "socially and sparingly" but that "not too long ago" he "drank way too much" although has reduced his use to one-to-two standard size drinks when "out with others," a remote history of tobacco use, social use of cannabis in college, and current use of a standard size cup  of coffee approximately four days a week as well as daily use of two-to-three standard size glass of soda. He denied ever experiencing suicidal or homicidal ideation, plan, or intent; obsessions and compulsions; or legal involvement.     Margarite Gouge, PsyD

## 2021-12-08 ENCOUNTER — Ambulatory Visit: Payer: BLUE CROSS/BLUE SHIELD | Admitting: Psychology

## 2021-12-08 DIAGNOSIS — F89 Unspecified disorder of psychological development: Secondary | ICD-10-CM

## 2021-12-08 NOTE — Progress Notes (Signed)
Date: 12/08/2021 Appointment Start Time: 5:05pm (patient arrived late to the appointment) Duration: 60 minutes Provider: Helmut Muster, PsyD Type of Session: Testing Appointment for Evaluation  Location of Patient: Home Location of Provider: Provider's Home (private office) Type of Contact: WebEx video visit with audio  Session Content: Today's appointment was a telepsychological visit due to COVID-19. Mario Shaffer is aware it is his responsibility to secure confidentiality on his end of the session. Prior to proceeding with today's appointment, Mario Shaffer's physical location at the time of this appointment was obtained as well a phone number he could be reached at in the event of technical difficulties. Mario Shaffer denied anyone else being present in the room or on the virtual appointment. This provider reviewed that video should not be captured, photos should not be taken, nor should testing stimuli be copied or recorded as it would be a copyright violation. Mario Shaffer expressed understanding of the aforementioned, and verbally consented to proceed. The WAIS-IV was administered, scored, and interpreted by this evaluator  Billing codes will be input on the feedback appointment. There are no billing codes for the testing appointment.   Provisional DSM-5 diagnosis(es):  F89 Unspecified Disorder of Psychological Development   Plan: Mario Shaffer was scheduled for a feedback appointment on 12/22/2021 at 5pm via WebEx video visit with audio.                Margarite Gouge, PsyD

## 2021-12-09 ENCOUNTER — Ambulatory Visit: Payer: BLUE CROSS/BLUE SHIELD | Admitting: Psychology

## 2021-12-22 ENCOUNTER — Ambulatory Visit (INDEPENDENT_AMBULATORY_CARE_PROVIDER_SITE_OTHER): Payer: BLUE CROSS/BLUE SHIELD | Admitting: Psychology

## 2021-12-22 DIAGNOSIS — F902 Attention-deficit hyperactivity disorder, combined type: Secondary | ICD-10-CM

## 2021-12-22 NOTE — Progress Notes (Signed)
Testing and Report Writing Information: The following measures  were administered, scored, and interpreted by this provider:  Generalized Anxiety Disorder-7 (GAD-7; 5 minutes), Patient Health Questionnaire-9 (PHQ-9; 5 minutes), Wechsler Adult Intelligence Scale-Fourth Edition (WAIS-IV; 60 minutes), CNS Vital Signs (45 minutes), Adult Attention Deficit/Hyperactivity Disorder Self-Report Scale Checklist (ASRSv1.1; 15 minutes), Behavior Rating Inventory for Executive Function - A - Self Report (BRIEF A; 10 minutes) and Behavior Rating Inventory for Executive Function - A - Informant (BRIEF-A; 10 minutes) , Personality Assessment Inventory (PAI; 50 minutes). A total of 200 minutes was spent on the administration and scoring of the aforementioned measures and codes [id number] and [id number] (5 units) were billed.  Please see the assessment for additional details. This provider completed the written report which includes integration of patient data, interpretation of standardized test results, interpretation of clinical data, review of information provided by [facility] and any collateral information/documentation, and clinical decision making (215 minutes in total).  Feedback Appointment: Date: 12/22/2021 Appointment Start Time: 5pm Duration: 45 minutes Provider: Clarice Pole, PsyD Type of Session: Feedback Appointment for Evaluation  Location of Patient: Home Location of Provider: Provider's Home (private office) Type of Contact: Webex video visit with audio  Session Content: Today's appointment was a telepsychological visit due to COVID-19. Demani is aware it is his responsibility to secure confidentiality on his end of the session. He provided verbal consent to proceed with today's appointment. Prior to proceeding with today's appointment, Jaremy's physical location at the time of this appointment was obtained as well a phone number he could be reached at in the event of technical difficulties. Kule denied anyone  else being present in the room or on the virtual appointment.  This provider and Jakhai completed the interactive feedback session which includes reviewing the following measures: Generalized Anxiety Disorder-7 (GAD-7), Patient Health Questionnaire-9 (PHQ-9), Wechsler Adult Intelligence Scale-Fourth Edition (WAIS-IV), CNS Vital Signs, Adult Attention Deficit/Hyperactivity Disorder Self-Report Scale Checklist (ASRSv1.1), Behavior Rating Inventory for Executive Function - A - Self Report (Brief- A), Personality Assessment Inventory (PAI). During this interactive feedback session, results of the aforementioned measures, treatment recommendations, and diagnostic conclusions were discussed.   The interactive feedback session was completed today and a total of 45 minutes was spent on feedback. Code [id number] was billed for feedback session.   DSM-5 Diagnosis(es):  F90.2 Attention-Deficit/Hyperactivity Disorder, Combined Presentation, Moderate  Time Requirements: Assessment scoring and interpreting: 200 minutes (billing code [phone] and [phone] [5 units]) Feedback: 45 minutes (billing code [phone]) Report writing: 215 total minutes. 12/07/2021: 4:35-4:55pm and 8-9:15pm. 12/08/2021: 7:30-7:45pm. 12/17/2021: 7:25-8:20pm. 12/22/2021: 6:25-6:55pm. 12/23/2021: 7:50-8am and 11:05-11:15am.  (billing code [phone] [4 units])  Plan: Loreli Slot provided verbal consent for his evaluation to be sent via e-mail. No further follow-up planned by this provider.       CONFIDENTIAL PSYCHOLOGICAL EVALUATION ______________________________________________________________________________  Name: Elaina Hoops   Date of Birth: 1995/07/15    Age: 26 Dates of Evaluation: 12/02/2021, 12/06/2021, 12/07/2021, and 12/08/2021  SOURCE AND REASON FOR REFERRAL: Mr. Sevak Chennault was referred by Dr. Riki Sheer for an evaluation to ascertain if he meets criteria for Attention Deficit/Hyperactivity Disorder (ADHD).   EVALUATIVE PROCEDURES: Clinical  Interview with Mr. Finnlee Kuhnle (12/02/2021) Wechsler Adult Intelligence Scale-Fourth Edition (WAIS-IV; 12/08/2021) CNS Vital Signs (12/07/2021) Adult Attention Deficit/Hyperactivity Disorder Self-Report Scale Checklist (12/07/2021) Behavior Rating Inventory for Executive Function - A - Self Report (BRIEF-A; 12/07/2021) and Informant (12/06/2021)  Personality Assessment Inventory (PAI; 12/07/2021) Patient Health Questionnaire-9 (PHQ-9) Generalized Anxiety Disorder-7 (GAD-7)   BACKGROUND INFORMATION AND PRESENTING PROBLEM: Mr.  Kahn Ledon is a 26 year old male who resides in [place], [place] ([place]  Mr. Brandow discussed how he was diagnosed with ADHD and prescribed Adderall by his pediatrician when "pretty young," noting use of Adderall was helpful and improved his ability to concentrate and retain information. He further discussed having utilized Adderall until middle school but his mother eventually "weaned him off of it" as she worried about "toxins." He noted use of a Daytrana patch in college, which "worked really well" but caused reduced appetite, "short temper," and sleeping issues. He indicated his ADHD-related concerns have become exacerbated recently secondary to childcare responsibilities, and he is pursuing an ADHD evaluation as his PCP expressed concerns his last evaluation was "likely not in-depth" and suggested he pursue an in-depth ADHD evaluation prior to ongoing management of ADHD-related concerns. He described his ADHD-related concerns as including task initiation issues that led to "last minute cramming" of school assignments, household chores, and "pretty much everything;" "jumping hobbies more than anybody [he] knows;" being easily distracted by visual movements, noises, and "random thoughts" in his head;" difficulty listening to people even when they are speaking directly to him despite his intentions to listen and others commenting on a perceived lack of listening; regular  hyperfixation on his interests (e.g., video games, movies, TV shows, and his family's wellbeing) which can lead to the detriment of other higher priority tasks (e.g., sleeping and eating) and his wife commenting he "focuses on nothing or all of [his] attention on one thing;" organization issues that lead to "areas quickly becoming cluttered" and an extended time being required to clean as he does not "put things in their required spot;" often missing small details or making careless mistakes (e.g., misplacing items and missing warning signs of a problem); problems planning that lead to him forgetting appointments, not following through on plans, and trouble keeping commitments" without his wife's assistance; habitually fidgeting (e.g., playing with items near him and swiveling in his chair); often feeling he has to be moving or doing something when seated for extended periods (e.g., starting "random tasks [he] sees" and "playing on his phone); a belief he talks too much as well as others indicating he does, which he attributed to problems prioritizing information he shares with others; interrupting others and his wife commenting he "does it all the time" as he worries he will forget what he wants to say or will later wish he had said it; difficulty waiting his turn that has improved since childhood; task maintenance difficulties as he "sees other tasks [he] wants to do" and may forget the original task; driving much faster than others as he "has a lead foot" and generally drives ten or more miles per hour over the speed limit, adding he has an urge to "beat the time estimates" for the trip which has resulted in one speeding ticket; impulsive purchase decisions that contributed to financial strains in the past, although have improved with his wife's support and various financial responsibilities; not reading instructions for tasks as he "enjoys figuring things out" but it can cause an extended time to be required to  complete tasks; feeling irritable or overstimulated as a result of "consecutive loud noises," being confronted or criticized, being required to shift his attention, or multiple people talking to him; and trouble doing things in their proper order or sequence as he has a "bad habit" of trying to skip steps or "do multiple steps at once," which contributes to mistakes and an extended time being required to  complete tasks. He also discussed a period of low mood and apathy that lasted approximately ten months and was largely secondary to ADHD-related issues. He expressed a belief his ADHD-related concerns are independent of mood and consistent. He stated his coping strategies include attaching importance to tasks, his wife's support, and excelling with tasks that are challenging and continually changing.  Mr. Langel denied awareness of experiencing any developmental milestone delays, although noted he was diagnosed with dyslexia and required "extra help" with reading and writing. He described himself as an "average student," and shared how he enjoyed "hands on" classes (e.g., welding, small engine, and plant identification) classes in high school. 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  Mr. Westermeyer described his medical history as significant for knee injury and "several concussions" from football in high school," adding he believes he still experiences migraines from concussions. He noted his ADHD-related concerns were  occurring prior to these head injuries. He also described a history of snoring if sleeping on his back, tossing and turning while sleeping, and not feeling rested upon waking. He reported use of alcohol "socially and sparingly" but that "not too long ago" he "drank way too much" although has reduced his use to one-to-two standard size drinks when "out with others," a remote history of tobacco use, social use of cannabis in college, and current use of a standard size cup of coffee approximately four days a week as well as daily use of two-to-three standard size glass of soda. He denied ever experiencing suicidal or homicidal ideation, plan, or intent; obsessions and compulsions; or legal involvement.   BEHAVIORAL OBSERVATIONS: Mr. Pullian presented five minutes late for the evaluation. He was well-groomed. He was oriented to time, place, person, and purpose of the appointment. Throughout the course of the evaluation, he maintained appropriate eye contact. His thought processes and content were logical, coherent, and goal-directed. There were no overt signs of a thought disorder or perceptual disturbances, nor did she report such symptomatology. There was no evidence of paraphasias (i.e., errors in speech, gross mispronunciations, and word substitutions), repetition deficits, or disturbances in volume or prosody (i.e., rhythm and intonation). Overall, based on Mr. Prendes approach to testing, the current results are believed to be a good estimate of his abilities.  PROCEDURAL CONSIDERATIONS:  Psychological testing measures were conducted through a virtual visit with video and audio capabilities, but otherwise in a standard manner.   The Wechsler Adult Intelligence Scale, Fourth Edition (WAIS-IV) was administered via remote telepractice using digital stimulus materials on [employer]'s Q-global system. The remote testing environment appeared free of distractions, adequate rapport was established with the examinee  via video/audio capabilities, and Mr. Gardipee appeared appropriately engaged in the task throughout the session. No significant technological problems or distractions were noted during administration. Modifications to the standardization procedure included: none. The WAIS-IV subtests, or similar tasks, have received initial validation in several samples for remote telepractice and digital format administration, and the results are considered a valid description of Mr. Brunkhorst' skills and abilities.  CLINICAL  FINDINGS:  COGNITIVE FUNCTIONING  Wechsler Adult Intelligence Scale, Fourth Edition (WAIS-IV):  Mr. Enyeart completed subtests of the WAIS-IV, a full-scale measure of cognitive ability. He completed subtests of the WAIS-IV, a full-scale measure of cognitive ability. The WAIS-IV is comprised of four indices that measure cognitive processes that are components of intellectual ability; however, only subtests from the Verbal Comprehension and Working Memory indices were administered. As a result, Full-Scale-IQ (FSIQ) and General Ability Index (GAI) were unable to be determined.   WAIS-IV Scale/Subtest IQ/Scaled Score 95% Confidence Interval Percentile Rank Qualitative Description  Verbal Comprehension (VCI) 125 118-130 95 Superior  Similarities 14     Vocabulary 15     Information 14     Working Memory (WMI) 114 106-120 82 High Average  Digit Span 11     Arithmetic 14       The Verbal Comprehension Index (VCI) provides a measure of one's ability to receive, comprehend, and express language. It also measures the ability to retrieve previously learned information and to understand relationships between words and concepts presented orally. Mr. Nuttall obtained a VCI scaled score of 125 (95th percentile) placing him in the superior range compared to same-aged peers. His performance on the subtests comprising this index was similar.     The Working Memory Index (WMI) provides a measure of one's  ability to sustain attention, concentrate, and exert mental control. Mr. Steppe obtained a WMI scaled score of 114 (82nd percentile), placing him in the high average range compared to same-aged peers. The difference between his VCI and WMI scores is significant at the .05 level, which suggests his ability to sustain attention, concentrate, and exert mental control is a relative weakness. Moreover, Mr. Evrard score on the Arithmetic subtest is higher than his score on Digit Span, which may indicate specific strengths in arithmetic computational skills rather than a general proficiency in working memory.   ATTENTION AND PROCESSING  CNS Vital Signs: The CNS Vital Signs assessment evaluates the neurocognitive status of an individual and covers a range of mental processes. The results of the CNS Vital Signs testing indicated average neurocognitive processing ability. Regarding attention, simple attention was in the average range and sustained attention and complex attention were strengths in the above range. Executive functioning and cognitive flexibility were in the average range. Psychomotor speed, motor speed, and processing speed were strengths in the above range. Reaction time was average. Visual memory (images) and verbal memory (words) were strengths in the above range. Mr. Denio results were free of deficits, and he demonstrated strengths in verbal and visual memory, psychomotor speed, complex attention, processing speed, working memory, sustained attention, and motor speed. Upon follow-up, he described how he treated the CNS like it was a "video game" and a "competition," indicating this likely helped him sustain his attention and improved his overall performance on the measure.  Domain  Standard Score Percentile Validity Indicator Guideline  Neurocognitive Index 109 73 Yes Average  Composite Memory 122 93 Yes Above  Verbal Memory 115 84 Yes Above  Visual Memory 121 92 Yes Above  Psychomotor  Speed 116 86 Yes Above  Reaction Time 93 32 Yes Average  Complex Attention 110 75 Yes Above  Cognitive Flexibility 102 55 Yes Average  Processing Speed  112 79 Yes Above  Executive Function 110 75 Yes Above  Working Memory 112 79 Yes Above  Sustained Attention 110 75 Yes Above  Simple Attention 108 70 Yes Average  Motor Speed 112 79 Yes Above  EXECUTIVE FUNCTION  Behavior Rating Inventory of Executive Function, Second Edition (BRIEF-A) SELF-REPORT: Mr. Hooven completed the Self-Report Form of the Behavior Rating Inventory of Executive Function-Adult Version (BRIEF-A), which has three domains that evaluate cognitive, behavioral, and emotional regulation, and a Global Executive Composite score provides an overall snapshot of executive functioning. There are no missing item responses in the protocol.  The Negativity, Infrequency, and Inconsistency scales are not elevated, suggesting he did not respond to the protocol in an overly negative, haphazard, extreme, or inconsistent manner. In the context of these validity considerations, ratings of Mr. Vinyard' everyday executive function suggest some areas of concern. The overall index, the Global Executive Composite (GEC), was elevated (GEC T = 76, %ile = 99). Both the Behavioral Regulation (BRI) and the Metacognition (MI) Indexes were elevated (BRI T = 67, %ile = 92 and MI T = 79, %ile = >99).  Mr. Guilbault indicated difficultly with his ability to inhibit impulsive responses, monitor social behavior, initiate problem solving or activity, sustain working memory, plan and organize problem-solving approaches, and attend to task-oriented output. He did not describe his ability to adjust to changes in routine or task demands, modulate emotions, and organize environment and materials as problematic. Mr. Stocks' elevated scores on the Inhibit scale, and the Behavioral Regulation and the Metacognition Indexes, suggest Mr. Tumlinson is perceived as having poor  inhibitory control and/or suggest more global behavioral dysregulation is having a negative effect on active metacognitive problem solving.  Scale/Index  Raw Score T Score Percentile  Inhibit 18 70 97  Shift 12 64 92  Emotional Control 17 54 60  Self-Monitor 15 76 99  Behavioral Regulation Index (BRI) 62 67 92  Initiate 19 73 98  Working Memory 21 83 >99  Plan/Organize 26 81 >99  Task Monitor 15 77 >99  Organization of Materials 17 61 85  Metacognition Index (MI) 98 79 >99  Global Executive Composite (GEC) 160 76 99   Validity Scale Raw Score Cumulative Percentile Protocol Classification  Negativity 2 0 - 98.3 Acceptable  Infrequency 0 0 - 97.3 Acceptable  Inconsistency 5 0 - 99.2 Acceptable   Behavior Rating Inventory of Executive Function, Second Edition (BRIEF-A) INFORMANT:  Kawan Gillen' spouse, Kadrien Vanorden, completed the Informant Form of the Behavior Rating Inventory of Executive Function-Adult Version (BRIEF-A), which is equivalent to the Self-Report version and has three domains that evaluate cognitive, behavioral, and emotional regulation, and a Global Executive Composite score provides an overall snapshot of executive functioning. There are no missing item responses in the protocol.  The Negativity, Infrequency, and Inconsistency scales are not elevated, suggesting she did not respond to the protocol in an overly negative, haphazard, extreme, or inconsistent manner. In the context of these validity considerations, Ms. [employer]' ratings of Mr. Rodas' everyday executive function suggest some areas of concern. The overall index, the Global Executive Composite (GEC), was within the non-elevated range for age ([place] T = 72, %ile = 87). The Behavioral Regulation Index (BRI) was within normal limits (BRI T = 53, %ile = 65) and the Metacognition Index (MI) was elevated (MI T = 69, %ile = 94).  Ms. Gustavus Basaldua indicated Mr. Ganesan experiences difficultly with his ability to  sustain working memory, plan and organize problem-solving approaches, and attend to task-oriented output. Ms. Thelmer Klitz did not describe his ability to inhibit impulsive responses, adjust to changes in routine or task demands, modulate emotions, monitor social behavior, initiate problem solving or activity, and organize environment and materials as  problematic. The overall profile suggests Mr. Stegman experiences difficulties with working memory and with planning and organization that interfere with his ability to complete everyday tasks at home or at work. Individuals with similar elevations on the Working Memory scale, and without significant elevations in the Behavioral Regulation Index scales, are often described as inattentive. Individuals with similar profiles may also have secondary difficulty developing and organizing a plan of approach for future-oriented problem solving. This profile is often seen in individuals with inattentive-type attentional disorders.  Scale/Index  Raw Score T Score Percentile  Inhibit 16 63 87  Shift 10 53 69  Emotional Control 12 43 33  Self-Monitor 13 61 86  Behavioral Regulation Index (BRI) 51 53 65  Initiate 17 62 90  Working Memory 20 77 99  Plan/Organize 24 69 96  Task Monitor 16 77 98  Organization of Materials 15 54 65  Metacognition Index (MI) 92 69 94  Global Executive Composite (GEC) 143 63 87   Validity Scale Raw Score Cumulative Percentile Protocol Classification  Negativity 2 0 - 98.5 Acceptable  Infrequency 0 0 - 93.3 Acceptable  Inconsistency 3 0 - 98.8 Acceptable   BEHAVIORAL FUNCTIONING   Patient Health Questionnaire-9 (PHQ-9): Mr. Lubitz completed the PHQ-9, a self-report measure that assesses symptoms of depression. He scored 3/27, which indicates minimal depression.   Generalized Anxiety Disorder- 7 (GAD-7): Mr. Levit completed the GAD-7, a self-report measure that assesses symptoms of anxiety. He scored 3/21, which indicates  minimal anxiety.   Adult ADHD Self-Report Scale Symptom Checklist (ASRS): Mr. Wanat reported the following symptoms as sometimes: avoiding or delaying getting started on tasks requiring a lot of thought, feeling overly active and compelled to do things, difficulty relaxing, and difficulty waiting for turn in turn taking situations. He endorsed the following symptoms as occurring often: difficulty wrapping up final details of a project following the completion of challenging aspects, difficulty getting things in order when a task requires organization, struggling to concentrate on what people say even when they are speaking directly to him, feeling restless or fidgety, and interrupting others when they are busy. He endorsed the following symptoms as very often: problems remembering appointments or obligations, fidgeting or squirming, making careless mistakes when working on boring or difficult projects, struggling to sustain attention when doing boring or repetitive work, Control and instrumentation engineer or has difficulty finding things, being distracted by noise around him, talking too much in social situations, and interrupting others or finishing their sentences. Endorsement of at least four items in Part A is highly consistent with ADHD in adults. The frequency scores of Part B provides additional cues. Mr. Castrogiovanni scored a 4/6 on Part A and 9/12 on Part B, which is considered a positive screening for ADHD.   Personality Assessment Inventory (PAI): The PAI is an objective inventory of adult personality. The validity indicators suggested Mr. Gutridge' profile is interpretable (ICN T = 43, INF T = 44, NIM T = 55, and PIM T = 36). He is endorsing frequent occurrences of common physical symptoms and vague complaints of ill health (SOM-S T = 70); prominent unhappiness and dysphoria (DEP T = 74) that includes thoughts of personal failure and concentration issues (ANX-C T = 61 and DEP-C T = 72), feelings of sadness and anhedonia  (DEP-A T = 72), and vegetative signs of depression (DEP-P T = 67); an activity level is somewhat higher than normal (MAN-A T = 60), impatience and being easily frustrated (MAN-I T = 64 and AGG-P T = 64),  a low tolerance for boredom and a desire for excitement (ANT-S T = 64), and a loosening of associations and difficulties with self-expression (SCZ-T T = 75); and uncertainty about major life issues and difficulties in maintaining a sense of purpose (BOR-I T = 71). He describes himself as warm, friendly, and valuing harmonious relationships (WRM T = 62); indicates having close, generally supportive relationships with friends and family (NON T = 39); and appears to acknowledge the need to make some changes, has a positive attitude toward the possibility of personal change, and accepts the importance of personal responsibility (RXR T = 48).    SUMMARY AND CLINICAL IMPRESSIONS: Mr. Jrue Ormes is a 27 year old male who was referred by Dr. Riki Sheer for an evaluation to determine if he currently meets criteria for a diagnosis of Attention-Deficit/Hyperactivity Disorder (ADHD).   Mr. Dettling described an extended history of ADHD-related concerns, noting he was diagnosed with ADHD and prescribed Adderall which was helpful in improving his concentration and information retention. He expressed a belief his ADHD-related concerns have become exacerbated recently, which he attributed to childcare responsibilities. He shared a history of low mood and apathy that lasted approximately ten months and was largely secondary to ADHD-related issues.   During the evaluation, Mr. Mcquown was administered assessments to measure his current cognitive abilities. His verbal comprehension abilities were in the superior range and similarly developed. His ability to sustain attention, concentrate, and exert mental control was in the high average range which suggests a relative weakness in attention, concentration, mental  control, and shorter-term auditory memory. Moreover, Mr. Carpio score on the Arithmetic subtest is higher than his score on Digit Span, which may indicate specific strengths in arithmetic computational skills rather than a general proficiency in working memory. Results of the CNS Vital Signs indicated an average neurocognitive processing ability. His results were free of deficits, and he demonstrated strengths in verbal and visual memory, psychomotor speed, complex attention, processing speed, working memory, sustained attention, and motor speed.   During the clinical interview and on self-report measures, Mr. Buyer endorsed executive functioning and concentration impairment, hyperactivity-related symptoms, and meeting full criteria for ADHD. When considering self-reported symptoms, endorsed and/or demonstrated impairment on measures of executive functioning and working memory, his spouse indicating he experiences several executive functioning-related issues, a pediatrician reportedly diagnosing him with ADHD as a child, and him noting past use of ADHD-related medications were helpful, a diagnosis of F90.2 Attention-Deficit/Hyperactivity Disorder, Combined Presentation, Moderate appears warranted. The specifier of "Moderate" was assigned he noted his current symptoms cause impairment in academic, occupational, social, and daily functioning. While Mr. Sipp endorsed a ten-month period of low mood, he described it as largely secondary to ADHD-related problems during college, which may be more suggestive of a past adjustment disorder versus a depressive episode.   DSM-5 Diagnostic Impressions: F90.2 Attention-Deficit/Hyperactivity Disorder, Combined Presentation, Moderate  RECOMMENDATIONS: Mr. Yarman would likely benefit from making use of strategies for ADHD symptoms:  Setting a timer to complete tasks. Breaking tasks into manageable chunks and spreading them out over longer periods of time with  breaks.  Utilizing lists and day calendars to keep track of tasks.  Answering emails daily.  Improving listening skills by asking the speaker to give information in smaller chunks and asking for explanation for clarification as needed. Leaving more than the anticipated time to complete tasks. It may help to keep tasks brief, well within your attention span, and a mix of both high and low interest tasks. Tasks may be  gradually increased in length. Practice proactive planning by setting aside time every evening to plan for the next day (e.g., prepare needed materials or pack the car the night before).  Learn how to make an effective and reasonable "to do" list of important tasks and priorities and always keep it easily accessible. Make additional copies in case it is lost or misplaced. Utilize visual reminders by posting appointments, "to do lists," or schedule in strategic areas at home and at work.  Practice using an appointment book, smart phone or other tech device, or a daily planning calendar, and learn to write down appointments and commitments immediately. Keep notepads or use a portable audio recorder to capture important ideas that would be beneficial to recall later. Learn and practice time management skills. Purchase a programmable alarm watch or set an alarm on smartphone to avoid losing track of time.  Use a color-coded file system, desk and closet organizers, storage boxes, or other organization devices to reduce clutter and improve efficiency and structure.  Implement ways to become more aware of your actions and to inhibit or adjust them as warranted (e.g., reviewing videos of your actions, consider consequences of obeying or not obeying the rules of various upcoming situations, have a trusted other to discuss plans with and/or provide cues to stop certain behaviors, and make visual cues for rules you would like to follow). Stay flexible and be prepared to change your plans as symptom  breakthroughs and crises are likely to occur periodically. Mr. Pepe may benefit from neurofeedback and mindfulness training to address symptoms of inattention.  Mr. Maresca would likely benefit from a consultation regarding medication for ADHD symptoms.   Individual therapeutic services may assist in improving coping skills for ADHD-related symptoms. Mental alertness/energy can be raised by increasing exercise; improving sleep; eating a healthy diet; and managing stress. Consult with a physician regarding any changes to physical regimen is recommended. "Failing at Normal: An ADHD Success Story" by Mellody Drown is a great overview of ADHD.  Organizations that are a good source of information on ADHD:  Children and Adults with Attention-Deficit/Hyperactivity Disorder (CHADD): chadd.org  Attention Deficit Disorder Association (ADDA): CondoFactory.com.cy ADD Resources: addresources.org ADD WareHouse: addwarehouse.com World Federation of ADHD: adhd-federation.org ADDConsults: https://www.hines.net/. Future evaluation if deemed necessary and/or to determine effectiveness of recommended interventions.    Clarice Pole, Psy.D. Licensed Psychologist - [facility] [id number]             Dolores Lory, PsyD

## 2021-12-29 ENCOUNTER — Ambulatory Visit: Payer: BLUE CROSS/BLUE SHIELD | Admitting: Family Medicine

## 2021-12-30 ENCOUNTER — Encounter: Payer: Self-pay | Admitting: Family Medicine

## 2021-12-30 ENCOUNTER — Ambulatory Visit: Payer: BLUE CROSS/BLUE SHIELD | Admitting: Family Medicine

## 2021-12-30 VITALS — BP 118/78 | HR 77 | Temp 98.2°F | Ht 74.0 in | Wt 250.0 lb

## 2021-12-30 DIAGNOSIS — F902 Attention-deficit hyperactivity disorder, combined type: Secondary | ICD-10-CM

## 2021-12-30 MED ORDER — AMPHETAMINE-DEXTROAMPHET ER 20 MG PO CP24: 20.0000 mg | ORAL_CAPSULE | Freq: Every day | ORAL | 0 refills | Status: DC

## 2021-12-30 NOTE — Patient Instructions (Signed)
Let me know if there are cost/stock issues.   Let us know if you need anything.

## 2021-12-30 NOTE — Progress Notes (Signed)
Chief Complaint  Patient presents with   Follow-up    Mario Shaffer is 26 y.o. male here for ADHD follow up.  Patient is currently on Concerta 36 mg daily and compliance is excellent. Symptoms are well controlled on this medication Side effects include feeling of anxiety and irritability. Patient would like to change to a different medication.  His father had similar issues on Ritalin. He did receive a formal diagnosis of ADHD combined type with psychology team. Denies tics, difficulties with sleep, self-medication, alcohol/drug abuse, chest pain, or palpitations.   Past Medical History:  Diagnosis Date   Depression    Headache    Migraine     BP 118/78   Pulse 77   Temp 98.2 F (36.8 C) (Oral)   Ht 6\' 2"  (1.88 m)   Wt 250 lb (113.4 kg)   SpO2 99%   BMI 32.10 kg/m  Gen- awake, alert, appearing stated age Heart- RRR Lungs- CTAB, no accessory muscle use Neuro- no facial tics Psych- age appropriate judgment and insight, normal mood and affect  Attention deficit hyperactivity disorder (ADHD), combined type - Plan: amphetamine-dextroamphetamine (ADDERALL XR) 20 MG 24 hr capsule  Chronic, uncontrolled, side effect of chronic medication.  Stop Concerta 36 mg daily.  Adderall XR 20 mg daily.  Follow-up in 1 month.  He will let me know if there are any cost or stock supply issues. Pt voiced understanding and agreement to the plan.  [place], DO 12/30/21 9:15 AM

## 2022-02-11 ENCOUNTER — Ambulatory Visit: Payer: BLUE CROSS/BLUE SHIELD | Admitting: Family Medicine

## 2022-02-11 ENCOUNTER — Encounter: Payer: Self-pay | Admitting: Family Medicine

## 2022-02-11 VITALS — BP 120/78 | HR 75 | Temp 98.5°F | Ht 74.5 in | Wt 250.0 lb

## 2022-02-11 DIAGNOSIS — F902 Attention-deficit hyperactivity disorder, combined type: Secondary | ICD-10-CM | POA: Diagnosis not present

## 2022-02-11 MED ORDER — AMPHETAMINE-DEXTROAMPHET ER 30 MG PO CP24: 30.0000 mg | ORAL_CAPSULE | ORAL | 0 refills | Status: DC

## 2022-02-11 NOTE — Patient Instructions (Signed)
Let me know if there are cost/supply issues.  Let us know if you need anything.  

## 2022-02-11 NOTE — Progress Notes (Signed)
Chief Complaint  Patient presents with   Follow-up    1 month on Adderall.  May need to increase dose     [name] is 26 y.o. male here for ADHD follow up.  Patient is currently on Adderall XR 20 mg/d and compliance is excellent. Symptoms include inattention. Side effects include slightly reduced . Patient believes their dose should be increased. Denies tics, weight loss, difficulties with sleep, self-medication, alcohol/drug abuse, chest pain, or palpitations.   Past Medical History:  Diagnosis Date   Depression    Headache    Migraine     BP 120/78   Pulse 75   Temp 98.5 F (36.9 C) (Oral)   Ht 6' 2.5" (1.892 m)   Wt 250 lb (113.4 kg)   SpO2 98%   BMI 31.67 kg/m  Gen- awake, alert, appearing stated age Heart- RRR Lungs- CTAB, no accessory muscle use Neuro- DTRs equal/symmetric over patella b/l Psych- age appropriate judgment and insight, normal mood and affect  Attention deficit hyperactivity disorder (ADHD), combined type  Chronic, not controlled. Increase Adderall XR 30 mg/d from 20 mg/d.  F/u in 1 mo to reck Pt voiced understanding and agreement to the plan.  Jilda Roche [place], DO 02/11/22 8:21 AM

## 2022-03-14 ENCOUNTER — Encounter: Payer: Self-pay | Admitting: Family Medicine

## 2022-03-14 ENCOUNTER — Ambulatory Visit: Payer: BLUE CROSS/BLUE SHIELD | Admitting: Family Medicine

## 2022-03-14 VITALS — BP 110/72 | HR 81 | Temp 98.4°F | Ht 75.0 in | Wt 248.2 lb

## 2022-03-14 DIAGNOSIS — Z79899 Other long term (current) drug therapy: Secondary | ICD-10-CM | POA: Diagnosis not present

## 2022-03-14 DIAGNOSIS — F902 Attention-deficit hyperactivity disorder, combined type: Secondary | ICD-10-CM

## 2022-03-14 MED ORDER — AMPHETAMINE-DEXTROAMPHET ER 30 MG PO CP24: 30.0000 mg | ORAL_CAPSULE | ORAL | 0 refills | Status: AC

## 2022-03-14 MED ORDER — AMPHETAMINE-DEXTROAMPHET ER 30 MG PO CP24: 30.0000 mg | ORAL_CAPSULE | ORAL | 0 refills | Status: DC

## 2022-03-14 NOTE — Patient Instructions (Signed)
Let us know if/when you need refills.   Avoid energy drinks in general.   Let us know if you need anything.

## 2022-03-14 NOTE — Progress Notes (Signed)
Chief Complaint  Patient presents with   Follow-up    Mario Shaffer is 26 y.o. male here for ADHD follow up.  Patient is currently on Adderall XR 30 mg/d and compliance is excellent. Symptoms are well controlled. Side effects include slight decrease in appetite. Patient believes their dose should be unchanged. Denies tics, weight loss, difficulties with sleep, self-medication, alcohol/drug abuse, chest pain, or palpitations.  Past Medical History:  Diagnosis Date   Depression    Headache    Migraine     BP 110/72 (BP Location: Left Arm, Patient Position: Sitting, Cuff Size: Large)   Pulse 81   Temp 98.4 F (36.9 C) (Oral)   Ht 6\' 3"  (1.905 m)   Wt 248 lb 4 oz (112.6 kg)   SpO2 98%   BMI 31.03 kg/m  Gen- awake, alert, appearing stated age Heart- RRR Lungs- CTAB, no accessory muscle use Neuro- no facial tics Psych- age appropriate judgment and insight, normal mood and affect  Attention deficit hyperactivity disorder (ADHD), combined type - Plan: amphetamine-dextroamphetamine (ADDERALL XR) 30 MG 24 hr capsule, amphetamine-dextroamphetamine (ADDERALL XR) 30 MG 24 hr capsule, amphetamine-dextroamphetamine (ADDERALL XR) 30 MG 24 hr capsule  Chronic, stable. Cont Adderall XR 30 mg/d. CSC and UDS updated today. Politely declined flu shot.  F/u in 6 mo for CPE. Pt voiced understanding and agreement to the plan.  [place], DO 03/14/22 7:21 AM

## 2022-03-14 NOTE — Addendum Note (Signed)
Addended by: Sharon Seller B on: 03/14/2022 07:24 AM   Modules accepted: Orders

## 2022-03-16 LAB — DRUG MONITORING PANEL 376104, URINE
Amphetamines: NEGATIVE ng/mL (ref <500)
Barbiturates: NEGATIVE ng/mL (ref <300)
Benzodiazepines: NEGATIVE ng/mL (ref <100)
Cocaine Metabolite: NEGATIVE ng/mL (ref <150)
Desmethyltramadol: NEGATIVE ng/mL (ref <100)
Opiates: NEGATIVE ng/mL (ref <100)
Oxycodone: NEGATIVE ng/mL (ref <100)
Tramadol: NEGATIVE ng/mL (ref <100)

## 2022-03-16 LAB — DM TEMPLATE

## 2022-03-28 ENCOUNTER — Telehealth: Payer: Self-pay | Admitting: Family Medicine

## 2022-03-28 NOTE — Telephone Encounter (Signed)
Nurse Assessment Nurse: Zorita Pang, RN, Deborah Date/Time (Eastern Time): 03/28/2022 10:17:40 AM Confirm and document reason for call. If symptomatic, describe symptoms. ---The caller states that he has had been taking Mucinex and Sudafed (plain) for hoarseness and cold like symptoms. Takes Adderall 30 mg for 2 months. States his heart rate is 108 and normal heart rate is 75-83. Denies any current chest pain. Feels anxious. Feels like he is getting a enough oxygen. Does the patient have any new or worsening symptoms? ---Yes Will a triage be completed? ---Yes Related visit to physician within the last 2 weeks? ---No Does the PT have any chronic conditions? (i.e. diabetes, asthma, this includes High risk factors for pregnancy, etc.) ---Yes List chronic conditions. ---knee pain, migraines with season changes, ADHD and ADD Is this a behavioral health or substance abuse call? ---No PLEASE NOTE: All timestamps contained within this report are represented as [place] Standard Time. CONFIDENTIALTY NOTICE: This fax transmission is intended only for the addressee. It contains information that is legally privileged, confidential or otherwise protected from use or disclosure. If you are not the intended recipient, you are strictly prohibited from reviewing, disclosing, copying using or disseminating any of this information or taking any action in reliance on or regarding this information. If you have received this fax in error, please notify us immediately by telephone so that we can arrange for its return to [place]. Phone: [phone], Toll-Free: [phone], Fax: [phone] Page: 2 of 2 Call Id: [id number] Guidelines Guideline Title Affirmed Question Affirmed Notes Nurse Date/Time Eilene Ghazi Time) Heart Rate and Heartbeat Questions Dizziness, lightheadedness, or weakness Womble, RN, Neoma Laming 03/28/2022 10:22:55 AM Disp. Time Eilene Ghazi Time) Disposition Final User 03/28/2022 10:14:02 AM Send to  Urgent Jeannette Corpus, Tiffany 03/28/2022 10:27:42 AM Go to ED Now Yes Zorita Pang, RN, Neoma Laming Final Disposition 03/28/2022 10:27:42 AM Go to ED Now Yes Zorita Pang, RN, Garrel Ridgel Disagree/Comply Comply Caller Understands Yes PreDisposition Call Doctor Care Advice Given Per Guideline * You need to be seen in the Emergency Department. * Another adult should drive. Referrals GO TO FACILITY OTHER - SPECIFY

## 2022-03-28 NOTE — Telephone Encounter (Signed)
Called informed the patient of PCP instructions. He stated he was feeling better and voice understanding of instructions.

## 2022-03-28 NOTE — Telephone Encounter (Signed)
Pt called stating he was feeling jittery, heart beating fast, skin felt as if it was crawling and just simply felt out of sorts after taking Mucinex DM, his adderall, Sudafed and Tylenol all at once this morning around 5:30 am since he was experiencing some cold-like symptoms.  I advised pt I was transferring him to Triage for further advice from the nurse.  Pt stated he had read the adderall and Mucinex DM were not a good combination to take together and were possibly causing some of these side effects.

## 2022-03-28 NOTE — Telephone Encounter (Signed)
Probably doesn't need to go to ER, just don't take Mucinex or Sudafed while on Adderall.

## 2022-09-13 ENCOUNTER — Encounter: Payer: BLUE CROSS/BLUE SHIELD | Admitting: Family Medicine

## 2022-10-21 ENCOUNTER — Encounter (HOSPITAL_BASED_OUTPATIENT_CLINIC_OR_DEPARTMENT_OTHER): Payer: Self-pay | Admitting: Emergency Medicine

## 2022-10-21 ENCOUNTER — Emergency Department (HOSPITAL_BASED_OUTPATIENT_CLINIC_OR_DEPARTMENT_OTHER)
Admission: EM | Admit: 2022-10-21 | Discharge: 2022-10-21 | Disposition: A | Discharge status: Home / Self Care | Payer: BLUE CROSS/BLUE SHIELD | Attending: Emergency Medicine | Admitting: Emergency Medicine

## 2022-10-21 ENCOUNTER — Ambulatory Visit (INDEPENDENT_AMBULATORY_CARE_PROVIDER_SITE_OTHER): Payer: BLUE CROSS/BLUE SHIELD | Admitting: Family Medicine

## 2022-10-21 ENCOUNTER — Encounter: Payer: Self-pay | Admitting: Family Medicine

## 2022-10-21 ENCOUNTER — Other Ambulatory Visit: Payer: Self-pay

## 2022-10-21 VITALS — BP 108/70 | HR 84 | Temp 98.4°F | Ht 74.0 in | Wt 245.5 lb

## 2022-10-21 DIAGNOSIS — L03114 Cellulitis of left upper limb: Secondary | ICD-10-CM | POA: Diagnosis not present

## 2022-10-21 DIAGNOSIS — S50861A Insect bite (nonvenomous) of right forearm, initial encounter: Secondary | ICD-10-CM | POA: Insufficient documentation

## 2022-10-21 DIAGNOSIS — Z0000 Encounter for general adult medical examination without abnormal findings: Secondary | ICD-10-CM

## 2022-10-21 DIAGNOSIS — W57XXXA Bitten or stung by nonvenomous insect and other nonvenomous arthropods, initial encounter: Secondary | ICD-10-CM | POA: Insufficient documentation

## 2022-10-21 LAB — COMPREHENSIVE METABOLIC PANEL
ALT: 12 U/L (ref 0–53)
AST: 20 U/L (ref 0–37)
Albumin: 4.4 g/dL (ref 3.5–5.2)
Alkaline Phosphatase: 81 U/L (ref 39–117)
BUN: 13 mg/dL (ref 6–23)
CO2: 32 mEq/L (ref 19–32)
Calcium: 9.5 mg/dL (ref 8.4–10.5)
Chloride: 103 mEq/L (ref 96–112)
Creatinine, Ser: 0.85 mg/dL (ref 0.40–1.50)
GFR: 118.8 mL/min (ref >60.00)
Glucose, Bld: 59 mg/dL — ABNORMAL LOW (ref 70–99)
Potassium: 3.9 mEq/L (ref 3.5–5.1)
Sodium: 142 mEq/L (ref 135–145)
Total Bilirubin: 0.8 mg/dL (ref 0.2–1.2)
Total Protein: 7.2 g/dL (ref 6.0–8.3)

## 2022-10-21 LAB — LIPID PANEL
Cholesterol: 168 mg/dL (ref 0–200)
HDL: 35.8 mg/dL — ABNORMAL LOW (ref >39.00)
LDL Cholesterol: 117 mg/dL — ABNORMAL HIGH (ref 0–99)
NonHDL: 132.01
Total CHOL/HDL Ratio: 5
Triglycerides: 75 mg/dL (ref 0.0–149.0)
VLDL: 15 mg/dL (ref 0.0–40.0)

## 2022-10-21 LAB — CBC
HCT: 48 % (ref 39.0–52.0)
Hemoglobin: 16.2 g/dL (ref 13.0–17.0)
MCHC: 33.9 g/dL (ref 30.0–36.0)
MCV: 86.6 fl (ref 78.0–100.0)
Platelets: 283 10*3/uL (ref 150.0–400.0)
RBC: 5.54 Mil/uL (ref 4.22–5.81)
RDW: 13.6 % (ref 11.5–15.5)
WBC: 5 10*3/uL (ref 4.0–10.5)

## 2022-10-21 MED ORDER — DOXYCYCLINE HYCLATE 100 MG PO CAPS: 100.0000 mg | ORAL_CAPSULE | Freq: Two times a day (BID) | ORAL | 0 refills | Status: DC

## 2022-10-21 NOTE — ED Provider Notes (Signed)
[place] [facility] AT [facility] Provider Note   CSN: [id number] Arrival date & time: 10/21/22  1941     History  Chief Complaint  Patient presents with   Insect Bite    Mario Shaffer is a 27 y.o. male.  Patient complains of a bite on his right forearm.  Patient reports he is leaving at 4 AM for [place] and he is concerned that he has an infection.  Patient thinks he may have been bitten by something today.  He did not see anything or feel anything he just developed a area of redness on his right arm.  Patient reports his wife is a Engineer, civil (consulting) and wanted him to come in to be evaluated  The history is provided by the patient. No language interpreter was used.       Home Medications Prior to Admission medications   Medication Sig Start Date End Date Taking? Authorizing Provider  doxycycline (VIBRAMYCIN) 100 MG capsule Take 1 capsule (100 mg total) by mouth 2 (two) times daily. 10/21/22  Yes Elson Areas, PA-C  amphetamine-dextroamphetamine (ADDERALL XR) 30 MG 24 hr capsule Take 1 capsule (30 mg total) by mouth every morning. 03/14/22   Sharlene Dory, DO  amphetamine-dextroamphetamine (ADDERALL XR) 30 MG 24 hr capsule Take 1 capsule (30 mg total) by mouth every morning. 04/13/22   Sharlene Dory, DO  amphetamine-dextroamphetamine (ADDERALL XR) 30 MG 24 hr capsule Take 1 capsule (30 mg total) by mouth every morning. 05/13/22   Sharlene Dory, DO      Allergies    Patient has no known allergies.    Review of Systems   Review of Systems  Skin:  Positive for wound.  All other systems reviewed and are negative.   Physical Exam Updated Vital Signs BP 120/88   Pulse 77   Temp 98.1 F (36.7 C)   Resp 15   Ht 6\' 2"  (1.88 m)   Wt 111.1 kg   SpO2 100%   BMI 31.46 kg/m  Physical Exam Vitals reviewed.  Constitutional:      Appearance: Normal appearance.  Musculoskeletal:        General: Swelling and tenderness present.   Skin:    Findings: Erythema present.     Comments: 10 cm red area right forearm, no fluctuance  Neurological:     General: No focal deficit present.     Mental Status: He is alert.  Psychiatric:        Mood and Affect: Mood normal.     ED Results / Procedures / Treatments   Labs (all labs ordered are listed, but only abnormal results are displayed) Labs Reviewed - No data to display  EKG None  Radiology No results found.  Procedures Procedures    Medications Ordered in ED Medications - No data to display  ED Course/ Medical Decision Making/ A&P                             Medical Decision Making Patient reports he thinks he may have been bitten by something on his right arm or that he has infection  Risk Prescription drug management. Risk Details: Patient advised warm compresses Benadryl.  Patient advised to watch area for the next 24 hours.  He is advised that if area has increased redness or swelling he should start doxycycline           Final Clinical Impression(s) / ED  Diagnoses Final diagnoses:  Insect bite of right forearm, initial encounter  Cellulitis of left upper extremity    Rx / DC Orders ED Discharge Orders          Ordered    doxycycline (VIBRAMYCIN) 100 MG capsule  2 times daily        10/21/22 2032           An After Visit Summary was printed and given to the patient.    Osie Cheeks 10/21/22 2036    Benjiman Core, MD 10/21/22 [phone]

## 2022-10-21 NOTE — Progress Notes (Signed)
Chief Complaint  Patient presents with   Annual Exam    Well Male Mario Shaffer is here for a complete physical.   His last physical was >1 year ago.  Current diet: in general, a "healthy" diet.   Current exercise: wt lifting Weight trend: starting to lose wt Fatigue out of ordinary? No. Seat belt? Yes.   Advanced directive? Possibly  Health maintenance Tetanus- Yes HIV- Yes Hep C- Yes  Past Medical History:  Diagnosis Date   Depression    Headache    Migraine      Past Surgical History:  Procedure Laterality Date   MENISCUS DEBRIDEMENT Left 2014    Medications  Current Outpatient Medications on File Prior to Visit  Medication Sig Dispense Refill   amphetamine-dextroamphetamine (ADDERALL XR) 30 MG 24 hr capsule Take 1 capsule (30 mg total) by mouth every morning. 30 capsule 0   amphetamine-dextroamphetamine (ADDERALL XR) 30 MG 24 hr capsule Take 1 capsule (30 mg total) by mouth every morning. 30 capsule 0   amphetamine-dextroamphetamine (ADDERALL XR) 30 MG 24 hr capsule Take 1 capsule (30 mg total) by mouth every morning. 30 capsule 0    Allergies No Known Allergies  Family History Family History  Problem Relation Age of Onset   Hyperlipidemia Mother    Cancer Mother    Diabetes Maternal Grandmother    Cancer Maternal Grandfather    Diabetes Maternal Grandfather    Cancer Paternal Grandmother    Hearing loss Paternal Grandfather     Review of Systems: Constitutional: no fevers or chills Eye:  no recent significant change in vision Ear/Nose/Mouth/Throat:  Ears:  no hearing loss Nose/Mouth/Throat:  no complaints of nasal congestion, no sore throat Cardiovascular:  no chest pain Respiratory:  no shortness of breath Gastrointestinal:  no abdominal pain, no change in bowel habits GU:  Male: negative for dysuria Musculoskeletal/Extremities:  no pain of the joints Integumentary (Skin/Breast):  no abnormal skin lesions reported Neurologic:  no  headaches Endocrine: No unexpected weight changes Hematologic/Lymphatic:  no night sweats  Exam BP 108/70 (BP Location: Left Arm, Patient Position: Sitting, Cuff Size: Large)   Pulse 84   Temp 98.4 F (36.9 C) (Oral)   Ht 6\' 2"  (1.88 m)   Wt 245 lb 8 oz (111.4 kg)   SpO2 99%   BMI 31.52 kg/m  General:  well developed, well nourished, in no apparent distress Skin:  no significant moles, warts, or growths Head:  no masses, lesions, or tenderness Eyes:  pupils equal and round, sclera anicteric without injection Ears:  canals without lesions, TMs shiny without retraction, no obvious effusion, no erythema Nose:  nares patent, mucosa normal Throat/Pharynx:  lips and gingiva without lesion; tongue and uvula midline; non-inflamed pharynx; no exudates or postnasal drainage Neck: neck supple without adenopathy, thyromegaly, or masses Lungs:  clear to auscultation, breath sounds equal bilaterally, no respiratory distress Cardio:  regular rate and rhythm, no bruits, no LE edema Abdomen:  abdomen soft, nontender; bowel sounds normal; no masses or organomegaly Genital (male): Deferred Rectal: Deferred Musculoskeletal:  symmetrical muscle groups noted without atrophy or deformity Extremities:  no clubbing, cyanosis, or edema, no deformities, no skin discoloration Neuro:  gait normal; deep tendon reflexes normal and symmetric Psych: well oriented with normal range of affect and appropriate judgment/insight  Assessment and Plan  Well adult exam - Plan: Lipid panel, CBC, Comprehensive metabolic panel   Well 27 y.o. male. Counseled on diet and exercise. Self testicular exams recommended at least monthly.  Other orders as above. Advanced directive form provided today.  Follow up in 6 mo pending the above workup. The patient voiced understanding and agreement to the plan.  Jilda Roche [place], DO 10/21/22 7:10 AM

## 2022-10-21 NOTE — ED Triage Notes (Signed)
Patient arrived via POV c/o insect bite to right forearm x 4hr pta. Patient has redness to right forearm, painful to touch. Patient is unsure of what bit him. Patient states 1/10 pain. Patient is AO x 4, VS WDL, normal gait.

## 2022-10-21 NOTE — Patient Instructions (Addendum)
Give [place] 2-3 business days to get the results of your labs back.   Keep the diet clean and stay active. Start getting your cardio in again.   Please get me a copy of your advanced directive form at your convenience.   Do monthly self testicular checks in the shower. You are feeling for lumps/bumps that don't belong. If you feel anything like this, let me know!  Let us know if you need anything.

## 2023-03-22 ENCOUNTER — Encounter: Payer: Self-pay | Admitting: Family Medicine

## 2023-03-22 ENCOUNTER — Ambulatory Visit: Payer: BLUE CROSS/BLUE SHIELD | Admitting: Family Medicine

## 2023-03-22 VITALS — BP 118/80 | HR 81 | Temp 99.1°F | Ht 74.5 in | Wt 257.1 lb

## 2023-03-22 DIAGNOSIS — N50812 Left testicular pain: Secondary | ICD-10-CM

## 2023-03-22 DIAGNOSIS — R339 Retention of urine, unspecified: Secondary | ICD-10-CM | POA: Diagnosis not present

## 2023-03-22 DIAGNOSIS — N50811 Right testicular pain: Secondary | ICD-10-CM

## 2023-03-22 LAB — URINALYSIS, MICROSCOPIC ONLY: RBC / HPF: NONE SEEN (ref 0–?)

## 2023-03-22 LAB — BASIC METABOLIC PANEL
BUN: 14 mg/dL (ref 6–23)
CO2: 30 meq/L (ref 19–32)
Calcium: 9.2 mg/dL (ref 8.4–10.5)
Chloride: 103 meq/L (ref 96–112)
Creatinine, Ser: 0.9 mg/dL (ref 0.40–1.50)
GFR: 116.43 mL/min (ref >60.00)
Glucose, Bld: 91 mg/dL (ref 70–99)
Potassium: 4 meq/L (ref 3.5–5.1)
Sodium: 141 meq/L (ref 135–145)

## 2023-03-22 NOTE — Progress Notes (Signed)
Chief Complaint  Patient presents with   Testicle Pain    Dull ache during the day, but sometimes a sharp pain. At night sudden urge to urinate, but cannot urinate Pressure Started this past summer    Subjective: Patient is a 27 y.o. male here for testicular pain.  Has been going on for several months. No inj or change in activity. Described as a dull ache. Not getting better. Comes and goes, usually coming 4-5 d per week. Intermittent swelling. No bruising, redness, perineal pain/pressure. Has pelvic pressure/pain. Had 1 episode of "cloudy" urine but no bleeding or discharge. He is having incomplete emptying and weak stream. Stools have been runny for the summer as well. No new sexual partners or insertive anal intercourse. [place] from 2018 showed 2 mm b/l epididymal cysts.   Past Medical History:  Diagnosis Date   Depression    Headache    Migraine     Objective: BP 118/80 (BP Location: Left Arm, Patient Position: Sitting, Cuff Size: Large)   Pulse 81   Temp 99.1 F (37.3 C) (Oral)   Ht 6' 2.5" (1.892 m)   Wt 257 lb 2 oz (116.6 kg)   SpO2 98%   BMI 32.57 kg/m  General: Awake, appears stated age Heart: RRR, no LE edema Lungs: CTAB, no rales, wheezes or rhonchi. No accessory muscle use Abd: BS+, S, ND, mild ttp in suprapubic region.  Rectal: Sphincter of good tone, prostate is not boggy or tender, not overly large Psych: Age appropriate judgment and insight, normal affect and mood  Assessment and Plan: Pain in both testicles - Plan: US Scrotum  Urinary retention - Plan: Urine Microscopic Only, Basic metabolic panel  Ck urine and BMP for kidney function to r/o post obstructive AKI and infection. Will ck US scrotum as he has a hx of epididymal cysts b/l. Wear supportive undergarments. Ice, Tylenol, NSAIDs prn. May refer to urology pending results.  The patient voiced understanding and agreement to the plan.  Jilda Roche [place], DO 03/22/23  7:24 AM

## 2023-03-22 NOTE — Patient Instructions (Addendum)
Give [place] 2-3 business days to get the results of your labs back.   Someone will reach out to schedule your ultrasound.   Wear supportive undergarments.   Ice/cold pack over area for 10-15 min twice daily.  OK to take Tylenol 1000 mg (2 extra strength tabs) or 975 mg (3 regular strength tabs) every 6 hours as needed.  Ibuprofen 400-600 mg (2-3 over the counter strength tabs) every 6 hours as needed for pain.  Let us know if you need anything.

## 2023-03-23 ENCOUNTER — Telehealth: Payer: Self-pay | Admitting: Family Medicine

## 2023-03-23 NOTE — Telephone Encounter (Signed)
Please advise 

## 2023-03-23 NOTE — Telephone Encounter (Signed)
Pt's wife called and stated that her husband, Mario Shaffer, saw pcp yesterday and has an ultrasound scheduled for Saturday. She wanted to know if it would be possible for the patient to have his kidneys examined as well with the imaging dept. at the time of this appointment since the patient is having issues with his urine. Would a separate order need to be placed? Please call and advise pt.

## 2023-03-24 ENCOUNTER — Encounter: Payer: Self-pay | Admitting: Family Medicine

## 2023-03-25 ENCOUNTER — Ambulatory Visit (HOSPITAL_BASED_OUTPATIENT_CLINIC_OR_DEPARTMENT_OTHER)
Admission: RE | Admit: 2023-03-25 | Discharge: 2023-03-25 | Disposition: A | Discharge status: Home / Self Care | Payer: BLUE CROSS/BLUE SHIELD | Source: Ambulatory Visit | Attending: Family Medicine | Admitting: Family Medicine

## 2023-03-25 DIAGNOSIS — N50812 Left testicular pain: Secondary | ICD-10-CM | POA: Insufficient documentation

## 2023-03-25 DIAGNOSIS — N50811 Right testicular pain: Secondary | ICD-10-CM | POA: Insufficient documentation

## 2023-04-03 ENCOUNTER — Other Ambulatory Visit: Payer: Self-pay | Admitting: Family Medicine

## 2023-04-03 DIAGNOSIS — N50812 Left testicular pain: Secondary | ICD-10-CM

## 2023-04-03 MED ORDER — MELOXICAM 15 MG PO TABS: 15.0000 mg | ORAL_TABLET | Freq: Every day | ORAL | 0 refills | Status: DC

## 2023-04-18 ENCOUNTER — Encounter: Payer: Self-pay | Admitting: Urology

## 2023-04-18 ENCOUNTER — Ambulatory Visit: Payer: BLUE CROSS/BLUE SHIELD | Admitting: Urology

## 2023-04-18 VITALS — BP 114/71 | HR 72 | Ht 74.0 in | Wt 250.0 lb

## 2023-04-18 DIAGNOSIS — N50812 Left testicular pain: Secondary | ICD-10-CM

## 2023-04-18 DIAGNOSIS — N451 Epididymitis: Secondary | ICD-10-CM

## 2023-04-18 LAB — URINALYSIS, ROUTINE W REFLEX MICROSCOPIC
Bilirubin, UA: NEGATIVE
Glucose, UA: NEGATIVE
Ketones, UA: NEGATIVE
Leukocytes,UA: NEGATIVE
Nitrite, UA: NEGATIVE
RBC, UA: NEGATIVE
Specific Gravity, UA: >1.03 — ABNORMAL HIGH (ref 1.005–1.030)
Urobilinogen, Ur: 0.2 mg/dL (ref 0.2–1.0)
pH, UA: 7 (ref 5.0–7.5)

## 2023-04-18 MED ORDER — DOXYCYCLINE HYCLATE 100 MG PO CAPS: 100.0000 mg | ORAL_CAPSULE | Freq: Two times a day (BID) | ORAL | 0 refills | Status: AC

## 2023-04-18 NOTE — Progress Notes (Signed)
Assessment: 1. Epididymitis, left   2. Pain in left testicle     Plan: I personally reviewed the patient's chart including provider notes, and imaging results. His history and exam suggest possible epididymitis.  He is doing much better after a course of meloxicam. I discussed a course of antibiotics.  He would like to see if his symptoms return. I will provide him with a prescription for doxycycline 100 mg twice daily x 10 days to use if he has a recurrence of his symptoms. He could also resume the meloxicam. Return to office as needed.   Chief Complaint:  Chief Complaint  Patient presents with   Testicle Pain    History of Present Illness:  Nowell Salkowski is a 27 y.o. male who is seen in consultation from [place], DO for evaluation of bilateral scrotal pain.  He noted onset of a dull ache in the bilateral scrotum in July 2024.  He developed more discomfort on the left over time.  No scrotal trauma.  No scrotal swelling or redness.  He also noted some urgency, suprapubic discomfort with radiation into the groin area and some postvoid dribbling.  The symptoms were present for approximately 2 months.  No dysuria or gross hematuria.  No pain with ejaculation. Urinalysis from 03/22/2023 showed 3-6 WBCs. Scrotal ultrasound from 04/02/2023 showed normal testes bilaterally, 3 mm right epididymal cyst, 3 mm left epididymal cyst.  He was treated with meloxicam which he took for approximately 10 days.  He discontinued the medication approximately 2 days ago.  He reports that the left scrotal discomfort has resolved.  His urinary symptoms have also improved.   Past Medical History:  Past Medical History:  Diagnosis Date   Depression    Headache    Migraine     Past Surgical History:  Past Surgical History:  Procedure Laterality Date   MENISCUS DEBRIDEMENT Left 2014    Allergies:  No Known Allergies  Family History:  Family History  Problem Relation Age of  Onset   Hyperlipidemia Mother    Cancer Mother    Diabetes Maternal Grandmother    Cancer Maternal Grandfather    Diabetes Maternal Grandfather    Cancer Paternal Grandmother    Hearing loss Paternal Grandfather     Social History:  Social History   Tobacco Use   Smoking status: Never  Vaping Use   Vaping status: Never Used  Substance Use Topics   Alcohol use: No   Drug use: No    Review of symptoms:  Constitutional:  Negative for unexplained weight loss, night sweats, fever, chills ENT:  Negative for nose bleeds, sinus pain, painful swallowing CV:  Negative for chest pain, shortness of breath, exercise intolerance, palpitations, loss of consciousness Resp:  Negative for cough, wheezing, shortness of breath GI:  Negative for nausea, vomiting, diarrhea, bloody stools GU:  Positives noted in HPI; otherwise negative for gross hematuria, dysuria, urinary incontinence Neuro:  Negative for seizures, poor balance, limb weakness, slurred speech Psych:  Negative for lack of energy, depression, anxiety Endocrine:  Negative for polydipsia, polyuria, symptoms of hypoglycemia (dizziness, hunger, sweating) Hematologic:  Negative for anemia, purpura, petechia, prolonged or excessive bleeding, use of anticoagulants  Allergic:  Negative for difficulty breathing or choking as a result of exposure to anything; no shellfish allergy; no allergic response (rash/itch) to materials, foods  Physical exam: BP 114/71   Pulse 72   Ht 6\' 2"  (1.88 m)   Wt 250 lb (113.4 kg)   BMI  32.10 kg/m  GENERAL APPEARANCE:  Well appearing, well developed, well nourished, NAD HEENT: Atraumatic, Normocephalic, oropharynx clear. NECK: Supple without lymphadenopathy or thyromegaly. LUNGS: Clear to auscultation bilaterally. HEART: Regular Rate and Rhythm without murmurs, gallops, or rubs. ABDOMEN: Soft, non-tender, No Masses. EXTREMITIES: Moves all extremities well.  Without clubbing, cyanosis, or  edema. NEUROLOGIC:  Alert and oriented x 3, normal gait, CN II-XII grossly intact.  MENTAL STATUS:  Appropriate. BACK:  Non-tender to palpation.  No CVAT SKIN:  Warm, dry and intact.   GU: Penis:  circumcised Meatus: Normal Scrotum: no erythema or edema Testis: NT, no nodules Epididymis: minimally tender on left; right NT   Results: U/A:  negative

## 2023-04-26 ENCOUNTER — Ambulatory Visit: Payer: BLUE CROSS/BLUE SHIELD | Admitting: Family Medicine

## 2023-05-01 ENCOUNTER — Other Ambulatory Visit: Payer: Self-pay | Admitting: Family Medicine

## 2024-04-02 ENCOUNTER — Other Ambulatory Visit: Payer: Self-pay | Admitting: Medical Genetics

## 2024-06-27 ENCOUNTER — Other Ambulatory Visit: Payer: Self-pay | Admitting: Medical Genetics

## 2024-06-27 DIAGNOSIS — Z006 Encounter for examination for normal comparison and control in clinical research program: Secondary | ICD-10-CM
